# Patient Record
Sex: Female | Born: 1980 | Race: White | Hispanic: No | Marital: Married | Smoking: Current every day smoker
Health system: Southern US, Community
[De-identification: ages and names within clinical notes are randomized; demographics above are authoritative.]

## PROBLEM LIST (undated history)

## (undated) DIAGNOSIS — K029 Dental caries, unspecified: Secondary | ICD-10-CM

## (undated) DIAGNOSIS — J45909 Unspecified asthma, uncomplicated: Secondary | ICD-10-CM

## (undated) DIAGNOSIS — G43909 Migraine, unspecified, not intractable, without status migrainosus: Secondary | ICD-10-CM

## (undated) DIAGNOSIS — F1010 Alcohol abuse, uncomplicated: Secondary | ICD-10-CM

## (undated) HISTORY — PX: OTHER SURGICAL HISTORY: SHX169

## (undated) HISTORY — DX: Dental caries, unspecified: K02.9

## (undated) HISTORY — DX: Migraine, unspecified, not intractable, without status migrainosus: G43.909

## (undated) HISTORY — DX: Unspecified asthma, uncomplicated: J45.909

---

## 2008-07-28 ENCOUNTER — Inpatient Hospital Stay (HOSPITAL_COMMUNITY): Admission: EM | Admit: 2008-07-28 | Discharge: 2008-07-31 | Payer: Self-pay | Admitting: Emergency Medicine

## 2009-01-05 ENCOUNTER — Emergency Department (HOSPITAL_COMMUNITY): Admission: EM | Admit: 2009-01-05 | Discharge: 2009-01-06 | Payer: Self-pay | Admitting: Emergency Medicine

## 2011-01-19 LAB — URINALYSIS, ROUTINE W REFLEX MICROSCOPIC
Nitrite: NEGATIVE
Protein, ur: NEGATIVE mg/dL
Specific Gravity, Urine: <1.005 — ABNORMAL LOW (ref 1.005–1.030)
Urobilinogen, UA: 0.2 mg/dL (ref 0.0–1.0)
pH: 5.5 (ref 5.0–8.0)

## 2011-01-19 LAB — WET PREP, GENITAL
Clue Cells Wet Prep HPF POC: NONE SEEN
Trich, Wet Prep: NONE SEEN
Yeast Wet Prep HPF POC: NONE SEEN

## 2011-01-19 LAB — PREGNANCY, URINE: Preg Test, Ur: POSITIVE

## 2011-01-19 LAB — HEMOGLOBIN AND HEMATOCRIT, BLOOD: Hemoglobin: 13.4 g/dL (ref 12.0–15.0)
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
## 2011-02-21 NOTE — Discharge Summary (Signed)
Destiny Clay, Destiny Clay             ACCOUNT NO.:  [id number]   MEDICAL RECORD NO.:  [id number]          PATIENT TYPE:  INP   LOCATION:  A323                          FACILITY:  [facility]   PHYSICIAN:  Monte Fantasia, MD  DATE OF BIRTH:  30-Apr-1981   DATE OF ADMISSION:  07/28/2008  DATE OF DISCHARGE:  10/23/2009LH                               DISCHARGE SUMMARY   HISTORY OF PRESENT ILLNESS:  A 30 year old lady patient admitted on  July 28, 2008 with left flank pain, urinary tract infection, to rule  out pyelonephritis.  The patient had an ultrasound of the kidneys done,  which showed right kidney nonobstructed, localized dilated collecting  system in the upper pole of the right kidney, etiology not known,  possible calyceal obstruction, no evidence of any parenchymal edema or  abscess, surgically absent left kidney.  The patient received  ciprofloxacin during the stay of the hospital and IV fluids, improved  well.  WBC count on the downward trend to at present from 18.5-10.5.  The patient is asymptomatic, afebrile.  Denies any complaints.  Examined  by the bedside today.   PHYSICAL EXAMINATION:  VITAL SIGNS:  Temperature of 98.3, pulse of 75,  respirations 16, and blood pressure 102/58.  HEENT:  No pallor.  No icterus.  Pupils equal and reacting to light.  NECK:  Supple.  No JVD.  No lymphadenopathy.  RESPIRATORY:  Air entry bilaterally equal.  No rales.  No rhonchi.  CARDIOVASCULAR:  S1 and S2 heard.  No murmurs.  ABDOMEN:  Soft.  No guarding.  No rigidity.  No tenderness.  Bowel  sounds are good.  BACK:  No costovertebral angle tenderness.  EXTREMITIES:  No edema.  Pedal pulses are felt.   LABORATORY DATA:  Total WBC count of 10.5, H&H 13.4/39, and platelet  count 300.  BMP; sodium is 137, potassium 4.4, chloride 106, bicarb 26,  glucose 91, BUN 10, and creatinine 0.7.  Urine cultures, no growth.   ASSESSMENT AND PLAN:  Urinary tract infection, to rule out  pyelonephritis.  The  patient at present has normal WBC count with no  temperature spikes and is asymptomatic at present.  We will continue on  ciprofloxacin oral for 7 days.  The patient advised to drink plenty of  oral fluids.  Plan to discharge today.  The patient is on deep venous  thrombosis and gastrointestinal prophylaxis.      Monte Fantasia, MD  Electronically Signed     MP/MEDQ  D:  07/31/2008  T:  08/01/2008  Job:  [id number] [id number]

## 2011-02-21 NOTE — Group Therapy Note (Signed)
NAMECLEOPATRA, Destiny Clay             ACCOUNT NO.:  [id number]   MEDICAL RECORD NO.:  [id number]          PATIENT TYPE:  INP   LOCATION:  A323                          FACILITY:  [facility]   PHYSICIAN:  Monte Fantasia, MD  DATE OF BIRTH:  [birth year]   DATE OF PROCEDURE:  DATE OF DISCHARGE:                                 PROGRESS NOTE   HISTORY:  A 30 year old lady patient was admitted on July 28, 2008,  with left flank pain and with urinary tract infection for possible  pyelonephritis.  The patient is feeling symptomatically much better, has  tolerated Levaquin very well, and has received IV fluids.  Urine  cultures and sensitivity sent, showed no growth.  The patient's white  count has been on a downward trend.  The patient has been afebrile with  no temperature spikes.   PHYSICAL EXAMINATION:  GENERAL:  The patient by the bedside denies any  complaints.  HEENT:  No pallor.  No icterus.  NECK:  Supple.  No lymphadenopathy.  RESPIRATORY:  Air entry bilaterally equal.  No rales.  No rhonchi.  ABDOMEN:  Soft.  No organomegaly.  No masses felt.  No distention.  No  guarding.  No rigidity.  EXTREMITIES:  No edema of feet.   LABORATORY DATA:  Total WBC count of 9.5, hemoglobin 13.2/38, and  platelet of 257.  Sodium 137, potassium 3.8, chloride 107, bicarb 25,  glucose 95, BUN 10, and creatinine 0.66.  UA with moderate leukocytes,  nitrites positive and many bacteria, and too numerous wbc's.  Urine  cultures, no growth.   ASSESSMENT AND PLAN:  Urinary tract infection.  The patient was placed  on IV antibiotics and IV fluids, tolerating.  She is on ciprofloxacin.  We will continue ciprofloxacin and change to p.o. ciprofloxacin in a.m.  We will continue IV fluids, advised to drink plenty of oral fluids.  WBC  count on a downward trend.  Plan to discharge the patient in a.m.  Deep  venous thrombosis and gastrointestinal prophylaxis given.      Monte Fantasia, MD  Electronically  Signed     MP/MEDQ  D:  07/30/2008  T:  07/31/2008  Job:  [id number]

## 2011-03-10 ENCOUNTER — Emergency Department (HOSPITAL_COMMUNITY)
Admission: EM | Admit: 2011-03-10 | Discharge: 2011-03-10 | Disposition: A | Discharge status: Home / Self Care | Payer: Self-pay | Attending: Emergency Medicine | Admitting: Emergency Medicine

## 2011-03-10 DIAGNOSIS — IMO0002 Reserved for concepts with insufficient information to code with codable children: Secondary | ICD-10-CM | POA: Insufficient documentation

## 2011-03-10 DIAGNOSIS — F17200 Nicotine dependence, unspecified, uncomplicated: Secondary | ICD-10-CM | POA: Insufficient documentation

## 2011-03-10 LAB — URINE MICROSCOPIC-ADD ON

## 2011-03-10 LAB — URINALYSIS, ROUTINE W REFLEX MICROSCOPIC
Bilirubin Urine: NEGATIVE
Glucose, UA: NEGATIVE mg/dL
Ketones, ur: NEGATIVE mg/dL
Nitrite: NEGATIVE
Protein, ur: NEGATIVE mg/dL
pH: 5.5 (ref 5.0–8.0)

## 2011-07-11 LAB — BASIC METABOLIC PANEL
BUN: 6
BUN: 8
CO2: 26
Calcium: 9.2
Calcium: 9.4
Chloride: 105
Chloride: 106
Chloride: 106
GFR calc Af Amer: >60
GFR calc Af Amer: >60
GFR calc non Af Amer: >60
Glucose, Bld: 100 — ABNORMAL HIGH
Glucose, Bld: 91
Potassium: 4
Potassium: 4.4
Sodium: 137
Sodium: 137

## 2011-07-11 LAB — CBC
HCT: 38.9
HCT: 39.9
Hemoglobin: 13.3
Hemoglobin: 14.2
MCHC: 34.2
MCV: 93.2
MCV: 93.7
Platelets: 294
Platelets: 300
RBC: 4.26
RDW: 13.6
RDW: 13.7
RDW: 13.9
RDW: 14.1
WBC: 14.4 — ABNORMAL HIGH

## 2011-07-11 LAB — DIFFERENTIAL
Basophils Absolute: 0
Basophils Relative: 0
Basophils Relative: 0
Basophils Relative: 0
Basophils Relative: 1
Eosinophils Absolute: 0.1
Eosinophils Absolute: 0.2
Eosinophils Absolute: 0.2
Eosinophils Relative: 1
Eosinophils Relative: 2
Lymphocytes Relative: 18
Lymphs Abs: 2
Lymphs Abs: 2.5
Lymphs Abs: 2.8
Monocytes Absolute: 0.7
Monocytes Absolute: 1.1 — ABNORMAL HIGH
Monocytes Absolute: 1.2 — ABNORMAL HIGH
Monocytes Relative: 7
Neutro Abs: 15.1 — ABNORMAL HIGH
Neutro Abs: 5.8
Neutro Abs: 7.1
Neutrophils Relative %: 61
Neutrophils Relative %: 68
Neutrophils Relative %: 72

## 2011-07-11 LAB — URINALYSIS, ROUTINE W REFLEX MICROSCOPIC
Bilirubin Urine: NEGATIVE
Glucose, UA: 100 — AB
Protein, ur: >300 — AB
Specific Gravity, Urine: 1.025
Urobilinogen, UA: 1

## 2011-07-11 LAB — URINE CULTURE
Colony Count: NO GROWTH
Special Requests: NEGATIVE

## 2012-08-28 ENCOUNTER — Other Ambulatory Visit (HOSPITAL_COMMUNITY)
Admission: RE | Admit: 2012-08-28 | Discharge: 2012-08-28 | Disposition: A | Discharge status: Home / Self Care | Payer: Medicaid Other | Source: Ambulatory Visit | Attending: Obstetrics and Gynecology | Admitting: Obstetrics and Gynecology

## 2012-08-28 DIAGNOSIS — Z1151 Encounter for screening for human papillomavirus (HPV): Secondary | ICD-10-CM | POA: Insufficient documentation

## 2012-08-28 DIAGNOSIS — Z113 Encounter for screening for infections with a predominantly sexual mode of transmission: Secondary | ICD-10-CM | POA: Insufficient documentation

## 2012-08-28 DIAGNOSIS — Z01419 Encounter for gynecological examination (general) (routine) without abnormal findings: Secondary | ICD-10-CM | POA: Insufficient documentation

## 2012-11-05 LAB — OB RESULTS CONSOLE HEPATITIS B SURFACE ANTIGEN: Hepatitis B Surface Ag: NEGATIVE

## 2012-11-05 LAB — OB RESULTS CONSOLE RPR: RPR: NONREACTIVE

## 2012-11-05 LAB — OB RESULTS CONSOLE RUBELLA ANTIBODY, IGM: Rubella: IMMUNE

## 2012-12-03 ENCOUNTER — Encounter: Payer: Self-pay | Admitting: *Deleted

## 2012-12-03 DIAGNOSIS — K029 Dental caries, unspecified: Secondary | ICD-10-CM

## 2012-12-03 DIAGNOSIS — R519 Headache, unspecified: Secondary | ICD-10-CM

## 2012-12-03 DIAGNOSIS — K089 Disorder of teeth and supporting structures, unspecified: Secondary | ICD-10-CM

## 2012-12-03 DIAGNOSIS — J45909 Unspecified asthma, uncomplicated: Secondary | ICD-10-CM | POA: Insufficient documentation

## 2012-12-31 ENCOUNTER — Telehealth: Payer: Self-pay | Admitting: Obstetrics & Gynecology

## 2012-12-31 NOTE — Telephone Encounter (Signed)
Spoke with patient. Pt. Had light pink spotting last pm and this am; none now. [redacted] weeks pregnant. Had not had recent sex. No straining with bowel movements. +pressure and tightening in belly. +baby movement. Sometimes cramping; some leaking. Pt unsure if it's urine. Advised pt she would need to be evaluated at [facility] at [facility]. Pt stated she couldn't get to [facility] but could go to [facility]. I advised pt would need to be seen somewhere.

## 2013-01-03 ENCOUNTER — Ambulatory Visit (INDEPENDENT_AMBULATORY_CARE_PROVIDER_SITE_OTHER): Payer: Medicaid Other | Admitting: Obstetrics & Gynecology

## 2013-01-03 VITALS — BP 120/80 | Wt 120.0 lb

## 2013-01-03 DIAGNOSIS — O09299 Supervision of pregnancy with other poor reproductive or obstetric history, unspecified trimester: Secondary | ICD-10-CM

## 2013-01-03 DIAGNOSIS — Z3490 Encounter for supervision of normal pregnancy, unspecified, unspecified trimester: Secondary | ICD-10-CM

## 2013-01-03 DIAGNOSIS — O2390 Unspecified genitourinary tract infection in pregnancy, unspecified trimester: Secondary | ICD-10-CM

## 2013-01-03 LAB — POCT URINALYSIS DIPSTICK
Glucose, UA: NEGATIVE
Leukocytes, UA: NEGATIVE
Nitrite, UA: NEGATIVE
Protein, UA: NEGATIVE

## 2013-01-03 MED ORDER — METRONIDAZOLE 0.75 % VA GEL: 1.0000 | VAGINAL | Status: DC

## 2013-01-03 NOTE — Progress Notes (Signed)
Patient has not been seen for 2 months.  Patient reports good fetal movement, denies any bleeding and no rupture of membranes symptoms or contraction Is complaining of a vaginal discharge.  Exam +BV  Metrogel qhs x 5   PNII at next appointment

## 2013-01-03 NOTE — Progress Notes (Signed)
C/o pressure and light pink  Infrequent spotting. Not taking prenatal vit. "makes her sick", c/o clear watery discharge.

## 2013-01-03 NOTE — Patient Instructions (Signed)
Pregnancy - Second Trimester The second trimester of pregnancy (3 to 6 months) is a period of rapid growth for you and your baby. At the end of the sixth month, your baby is about 9 inches long and weighs 1 1/2 pounds. You will begin to feel the baby move between 18 and 20 weeks of the pregnancy. This is called quickening. Weight gain is faster. A clear fluid (colostrum) may leak out of your breasts. You may feel small contractions of the womb (uterus). This is known as false labor or Braxton-Hicks contractions. This is like a practice for labor when the baby is ready to be born. Usually, the problems with morning sickness have usually passed by the end of your first trimester. Some women develop small dark blotches (called cholasma, mask of pregnancy) on their face that usually goes away after the baby is born. Exposure to the sun makes the blotches worse. Acne may also develop in some pregnant women and pregnant women who have acne, may find that it goes away. PRENATAL EXAMS  Blood work may continue to be done during prenatal exams. These tests are done to check on your health and the probable health of your baby. Blood work is used to follow your blood levels (hemoglobin). Anemia (low hemoglobin) is common during pregnancy. Iron and vitamins are given to help prevent this. You will also be checked for diabetes between 24 and 28 weeks of the pregnancy. Some of the previous blood tests may be repeated.  The size of the uterus is measured during each visit. This is to make sure that the baby is continuing to grow properly according to the dates of the pregnancy.  Your blood pressure is checked every prenatal visit. This is to make sure you are not getting toxemia.  Your urine is checked to make sure you do not have an infection, diabetes or protein in the urine.  Your weight is checked often to make sure gains are happening at the suggested rate. This is to ensure that both you and your baby are growing  normally.  Sometimes, an ultrasound is performed to confirm the proper growth and development of the baby. This is a test which bounces harmless sound waves off the baby so your caregiver can more accurately determine due dates. Sometimes, a specialized test is done on the amniotic fluid surrounding the baby. This test is called an amniocentesis. The amniotic fluid is obtained by sticking a needle into the belly (abdomen). This is done to check the chromosomes in instances where there is a concern about possible genetic problems with the baby. It is also sometimes done near the end of pregnancy if an early delivery is required. In this case, it is done to help make sure the baby's lungs are mature enough for the baby to live outside of the womb. CHANGES OCCURING IN THE SECOND TRIMESTER OF PREGNANCY Your body goes through many changes during pregnancy. They vary from person to person. Talk to your caregiver about changes you notice that you are concerned about.  During the second trimester, you will likely have an increase in your appetite. It is normal to have cravings for certain foods. This varies from person to person and pregnancy to pregnancy.  Your lower abdomen will begin to bulge.  You may have to urinate more often because the uterus and baby are pressing on your bladder. It is also common to get more bladder infections during pregnancy (pain with urination). You can help this by   drinking lots of fluids and emptying your bladder before and after intercourse.  You may begin to get stretch marks on your hips, abdomen, and breasts. These are normal changes in the body during pregnancy. There are no exercises or medications to take that prevent this change.  You may begin to develop swollen and bulging veins (varicose veins) in your legs. Wearing support hose, elevating your feet for 15 minutes, 3 to 4 times a day and limiting salt in your diet helps lessen the problem.  Heartburn may develop  as the uterus grows and pushes up against the stomach. Antacids recommended by your caregiver helps with this problem. Also, eating smaller meals 4 to 5 times a day helps.  Constipation can be treated with a stool softener or adding bulk to your diet. Drinking lots of fluids, vegetables, fruits, and whole grains are helpful.  Exercising is also helpful. If you have been very active up until your pregnancy, most of these activities can be continued during your pregnancy. If you have been less active, it is helpful to start an exercise program such as walking.  Hemorrhoids (varicose veins in the rectum) may develop at the end of the second trimester. Warm sitz baths and hemorrhoid cream recommended by your caregiver helps hemorrhoid problems.  Backaches may develop during this time of your pregnancy. Avoid heavy lifting, wear low heal shoes and practice good posture to help with backache problems.  Some pregnant women develop tingling and numbness of their hand and fingers because of swelling and tightening of ligaments in the wrist (carpel tunnel syndrome). This goes away after the baby is born.  As your breasts enlarge, you may have to get a bigger bra. Get a comfortable, cotton, support bra. Do not get a nursing bra until the last month of the pregnancy if you will be nursing the baby.  You may get a dark line from your belly button to the pubic area called the linea nigra.  You may develop rosy cheeks because of increase blood flow to the face.  You may develop spider looking lines of the face, neck, arms and chest. These go away after the baby is born. HOME CARE INSTRUCTIONS   It is extremely important to avoid all smoking, herbs, alcohol, and unprescribed drugs during your pregnancy. These chemicals affect the formation and growth of the baby. Avoid these chemicals throughout the pregnancy to ensure the delivery of a healthy infant.  Most of your home care instructions are the same as  suggested for the first trimester of your pregnancy. Keep your caregiver's appointments. Follow your caregiver's instructions regarding medication use, exercise and diet.  During pregnancy, you are providing food for you and your baby. Continue to eat regular, well-balanced meals. Choose foods such as meat, fish, milk and other low fat dairy products, vegetables, fruits, and whole-grain breads and cereals. Your caregiver will tell you of the ideal weight gain.  A physical sexual relationship may be continued up until near the end of pregnancy if there are no other problems. Problems could include early (premature) leaking of amniotic fluid from the membranes, vaginal bleeding, abdominal pain, or other medical or pregnancy problems.  Exercise regularly if there are no restrictions. Check with your caregiver if you are unsure of the safety of some of your exercises. The greatest weight gain will occur in the last 2 trimesters of pregnancy. Exercise will help you:  Control your weight.  Get you in shape for labor and delivery.  Lose weight   after you have the baby.  Wear a good support or jogging bra for breast tenderness during pregnancy. This may help if worn during sleep. Pads or tissues may be used in the bra if you are leaking colostrum.  Do not use hot tubs, steam rooms or saunas throughout the pregnancy.  Wear your seat belt at all times when driving. This protects you and your baby if you are in an accident.  Avoid raw meat, uncooked cheese, cat litter boxes and soil used by cats. These carry germs that can cause birth defects in the baby.  The second trimester is also a good time to visit your dentist for your dental health if this has not been done yet. Getting your teeth cleaned is OK. Use a soft toothbrush. Brush gently during pregnancy.  It is easier to loose urine during pregnancy. Tightening up and strengthening the pelvic muscles will help with this problem. Practice stopping your  urination while you are going to the bathroom. These are the same muscles you need to strengthen. It is also the muscles you would use as if you were trying to stop from passing gas. You can practice tightening these muscles up 10 times a set and repeating this about 3 times per day. Once you know what muscles to tighten up, do not perform these exercises during urination. It is more likely to contribute to an infection by backing up the urine.  Ask for help if you have financial, counseling or nutritional needs during pregnancy. Your caregiver will be able to offer counseling for these needs as well as refer you for other special needs.  Your skin may become oily. If so, wash your face with mild soap, use non-greasy moisturizer and oil or cream based makeup. MEDICATIONS AND DRUG USE IN PREGNANCY  Take prenatal vitamins as directed. The vitamin should contain 1 milligram of folic acid. Keep all vitamins out of reach of children. Only a couple vitamins or tablets containing iron may be fatal to a baby or young child when ingested.  Avoid use of all medications, including herbs, over-the-counter medications, not prescribed or suggested by your caregiver. Only take over-the-counter or prescription medicines for pain, discomfort, or fever as directed by your caregiver. Do not use aspirin.  Let your caregiver also know about herbs you may be using.  Alcohol is related to a number of birth defects. This includes fetal alcohol syndrome. All alcohol, in any form, should be avoided completely. Smoking will cause low birth rate and premature babies.  Street or illegal drugs are very harmful to the baby. They are absolutely forbidden. A baby born to an addicted mother will be addicted at birth. The baby will go through the same withdrawal an adult does. SEEK MEDICAL CARE IF:  You have any concerns or worries during your pregnancy. It is better to call with your questions if you feel they cannot wait, rather  than worry about them. SEEK IMMEDIATE MEDICAL CARE IF:   An unexplained oral temperature above 102 F (38.9 C) develops, or as your caregiver suggests.  You have leaking of fluid from the vagina (birth canal). If leaking membranes are suspected, take your temperature and tell your caregiver of this when you call.  There is vaginal spotting, bleeding, or passing clots. Tell your caregiver of the amount and how many pads are used. Light spotting in pregnancy is common, especially following intercourse.  You develop a bad smelling vaginal discharge with a change in the color from clear   to white.  You continue to feel sick to your stomach (nauseated) and have no relief from remedies suggested. You vomit blood or coffee ground-like materials.  You lose more than 2 pounds of weight or gain more than 2 pounds of weight over 1 week, or as suggested by your caregiver.  You notice swelling of your face, hands, feet, or legs.  You get exposed to German measles and have never had them.  You are exposed to fifth disease or chickenpox.  You develop belly (abdominal) pain. Round ligament discomfort is a common non-cancerous (benign) cause of abdominal pain in pregnancy. Your caregiver still must evaluate you.  You develop a bad headache that does not go away.  You develop fever, diarrhea, pain with urination, or shortness of breath.  You develop visual problems, blurry, or double vision.  You fall or are in a car accident or any kind of trauma.  There is mental or physical violence at home. Document Released: 09/19/2001 Document Revised: 12/18/2011 Document Reviewed: 03/24/2009 [employer] Patient Information 2013 [employer].  

## 2013-01-07 ENCOUNTER — Encounter (HOSPITAL_COMMUNITY): Payer: Self-pay

## 2013-01-07 ENCOUNTER — Emergency Department (HOSPITAL_COMMUNITY)
Admission: EM | Admit: 2013-01-07 | Discharge: 2013-01-08 | Disposition: A | Discharge status: Home / Self Care | Payer: Medicaid Other | Source: Home / Self Care | Attending: Emergency Medicine | Admitting: Emergency Medicine

## 2013-01-07 ENCOUNTER — Telehealth: Payer: Self-pay | Admitting: Obstetrics & Gynecology

## 2013-01-07 DIAGNOSIS — R05 Cough: Secondary | ICD-10-CM | POA: Insufficient documentation

## 2013-01-07 DIAGNOSIS — N898 Other specified noninflammatory disorders of vagina: Secondary | ICD-10-CM | POA: Insufficient documentation

## 2013-01-07 DIAGNOSIS — R059 Cough, unspecified: Secondary | ICD-10-CM | POA: Insufficient documentation

## 2013-01-07 DIAGNOSIS — R6883 Chills (without fever): Secondary | ICD-10-CM | POA: Insufficient documentation

## 2013-01-07 DIAGNOSIS — Z79899 Other long term (current) drug therapy: Secondary | ICD-10-CM | POA: Insufficient documentation

## 2013-01-07 DIAGNOSIS — O219 Vomiting of pregnancy, unspecified: Secondary | ICD-10-CM | POA: Insufficient documentation

## 2013-01-07 DIAGNOSIS — O99330 Smoking (tobacco) complicating pregnancy, unspecified trimester: Secondary | ICD-10-CM | POA: Insufficient documentation

## 2013-01-07 DIAGNOSIS — R509 Fever, unspecified: Secondary | ICD-10-CM | POA: Insufficient documentation

## 2013-01-07 DIAGNOSIS — R197 Diarrhea, unspecified: Secondary | ICD-10-CM

## 2013-01-07 DIAGNOSIS — O9989 Other specified diseases and conditions complicating pregnancy, childbirth and the puerperium: Secondary | ICD-10-CM | POA: Insufficient documentation

## 2013-01-07 DIAGNOSIS — J45909 Unspecified asthma, uncomplicated: Secondary | ICD-10-CM | POA: Insufficient documentation

## 2013-01-07 DIAGNOSIS — Z3490 Encounter for supervision of normal pregnancy, unspecified, unspecified trimester: Secondary | ICD-10-CM

## 2013-01-07 DIAGNOSIS — Z905 Acquired absence of kidney: Secondary | ICD-10-CM | POA: Insufficient documentation

## 2013-01-07 DIAGNOSIS — Z8679 Personal history of other diseases of the circulatory system: Secondary | ICD-10-CM | POA: Insufficient documentation

## 2013-01-07 NOTE — ED Notes (Signed)
Pt c/o vomiting that started today, states she is having some discomfort in her ribs.  Pt saw dr Emelda Fear recently for some abd discharge and some blood, but states he didn't do anything different for her.

## 2013-01-07 NOTE — Telephone Encounter (Signed)
Pt states "very sick, unable to keep anything down, has had clear watery vaginal discharged x several days, gush of fluids today, continues to feel leaking down leg, states having chest pain due to cough. Also stated decrease in FM. Pt told to go to [facility] to be evaluated. Pt verbalized understanding.

## 2013-01-08 ENCOUNTER — Telehealth: Payer: Self-pay | Admitting: Obstetrics and Gynecology

## 2013-01-08 ENCOUNTER — Inpatient Hospital Stay (HOSPITAL_COMMUNITY)
Admission: AD | Admit: 2013-01-08 | Discharge: 2013-01-13 | DRG: 765 | Disposition: A | Discharge status: Home / Self Care | Payer: Medicaid Other | Source: Ambulatory Visit | Attending: Obstetrics & Gynecology | Admitting: Obstetrics & Gynecology

## 2013-01-08 ENCOUNTER — Inpatient Hospital Stay (HOSPITAL_COMMUNITY): Payer: Medicaid Other

## 2013-01-08 ENCOUNTER — Encounter (HOSPITAL_COMMUNITY): Payer: Self-pay | Admitting: *Deleted

## 2013-01-08 DIAGNOSIS — O99344 Other mental disorders complicating childbirth: Secondary | ICD-10-CM | POA: Diagnosis present

## 2013-01-08 DIAGNOSIS — O99892 Other specified diseases and conditions complicating childbirth: Secondary | ICD-10-CM | POA: Diagnosis present

## 2013-01-08 DIAGNOSIS — O321XX0 Maternal care for breech presentation, not applicable or unspecified: Secondary | ICD-10-CM | POA: Diagnosis present

## 2013-01-08 DIAGNOSIS — F1910 Other psychoactive substance abuse, uncomplicated: Secondary | ICD-10-CM | POA: Diagnosis present

## 2013-01-08 DIAGNOSIS — O42919 Preterm premature rupture of membranes, unspecified as to length of time between rupture and onset of labor, unspecified trimester: Secondary | ICD-10-CM | POA: Diagnosis present

## 2013-01-08 DIAGNOSIS — O4290 Premature rupture of membranes, unspecified as to length of time between rupture and onset of labor, unspecified weeks of gestation: Principal | ICD-10-CM | POA: Diagnosis present

## 2013-01-08 DIAGNOSIS — G479 Sleep disorder, unspecified: Secondary | ICD-10-CM | POA: Diagnosis present

## 2013-01-08 DIAGNOSIS — Z302 Encounter for sterilization: Secondary | ICD-10-CM

## 2013-01-08 LAB — CBC
HCT: 36.2 % (ref 36.0–46.0)
Hemoglobin: 12.5 g/dL (ref 12.0–15.0)
MCHC: 34.5 g/dL (ref 30.0–36.0)
MCV: 87.7 fL (ref 78.0–100.0)
RDW: 14.4 % (ref 11.5–15.5)

## 2013-01-08 LAB — RAPID URINE DRUG SCREEN, HOSP PERFORMED
Barbiturates: POSITIVE — AB
Benzodiazepines: NOT DETECTED
Cocaine: NOT DETECTED
Tetrahydrocannabinol: NOT DETECTED

## 2013-01-08 LAB — BASIC METABOLIC PANEL
Calcium: 9.3 mg/dL (ref 8.4–10.5)
GFR calc Af Amer: >90 mL/min (ref >90)
GFR calc non Af Amer: >90 mL/min (ref >90)
Sodium: 134 mEq/L — ABNORMAL LOW (ref 135–145)

## 2013-01-08 LAB — WET PREP, GENITAL
Trich, Wet Prep: NONE SEEN
Yeast Wet Prep HPF POC: NONE SEEN

## 2013-01-08 LAB — CBC WITH DIFFERENTIAL/PLATELET
Eosinophils Absolute: 0 10*3/uL (ref 0.0–0.7)
Lymphocytes Relative: 9 % — ABNORMAL LOW (ref 12–46)
Lymphs Abs: 1.2 10*3/uL (ref 0.7–4.0)
MCH: 30.4 pg (ref 26.0–34.0)
Neutro Abs: 12 10*3/uL — ABNORMAL HIGH (ref 1.7–7.7)
Neutrophils Relative %: 86 % — ABNORMAL HIGH (ref 43–77)
Platelets: 320 10*3/uL (ref 150–400)
RBC: 4.44 MIL/uL (ref 3.87–5.11)
WBC: 13.8 10*3/uL — ABNORMAL HIGH (ref 4.0–10.5)

## 2013-01-08 LAB — TYPE AND SCREEN: ABO/RH(D): O POS

## 2013-01-08 LAB — URINALYSIS, ROUTINE W REFLEX MICROSCOPIC
Glucose, UA: NEGATIVE mg/dL
Leukocytes, UA: NEGATIVE
Protein, ur: 100 mg/dL — AB
Specific Gravity, Urine: >1.03 — ABNORMAL HIGH (ref 1.005–1.030)
Urobilinogen, UA: 1 mg/dL (ref 0.0–1.0)

## 2013-01-08 LAB — ABO/RH: ABO/RH(D): O POS

## 2013-01-08 LAB — URINE MICROSCOPIC-ADD ON

## 2013-01-08 MED ORDER — ZOLPIDEM TARTRATE 5 MG PO TABS
5.0000 mg | ORAL_TABLET | Freq: Every evening | ORAL | Status: DC | PRN
  Administered 2013-01-08 – 2013-01-10 (×3): 5 mg via ORAL
  Filled 2013-01-08 (×3): qty 1

## 2013-01-08 MED ORDER — PRENATAL MULTIVITAMIN CH
1.0000 | ORAL_TABLET | Freq: Every day | ORAL | Status: DC
  Administered 2013-01-09 – 2013-01-10 (×2): 1 via ORAL
  Filled 2013-01-08 (×2): qty 1

## 2013-01-08 MED ORDER — AMOXICILLIN 500 MG PO CAPS
500.0000 mg | ORAL_CAPSULE | Freq: Three times a day (TID) | ORAL | Status: DC
  Administered 2013-01-10 – 2013-01-11 (×2): 500 mg via ORAL
  Filled 2013-01-08 (×6): qty 1

## 2013-01-08 MED ORDER — ALBUTEROL SULFATE HFA 108 (90 BASE) MCG/ACT IN AERS
2.0000 | INHALATION_SPRAY | Freq: Four times a day (QID) | RESPIRATORY_TRACT | Status: DC | PRN
  Filled 2013-01-08: qty 6.7

## 2013-01-08 MED ORDER — AZITHROMYCIN 500 MG PO TABS
500.0000 mg | ORAL_TABLET | Freq: Every day | ORAL | Status: DC
  Administered 2013-01-08 – 2013-01-10 (×3): 500 mg via ORAL
  Filled 2013-01-08 (×5): qty 2

## 2013-01-08 MED ORDER — GUAIFENESIN 100 MG/5ML PO SOLN
5.0000 mL | ORAL | Status: DC | PRN
  Administered 2013-01-08 – 2013-01-09 (×4): 100 mg via ORAL
  Filled 2013-01-08 (×4): qty 15

## 2013-01-08 MED ORDER — LACTATED RINGERS IV SOLN
INTRAVENOUS | Status: DC
  Administered 2013-01-08 – 2013-01-11 (×8): via INTRAVENOUS

## 2013-01-08 MED ORDER — SODIUM CHLORIDE 0.9 % IV SOLN
2.0000 g | Freq: Four times a day (QID) | INTRAVENOUS | Status: AC
  Administered 2013-01-08 – 2013-01-10 (×8): 2 g via INTRAVENOUS
  Filled 2013-01-08 (×8): qty 2000

## 2013-01-08 MED ORDER — MAGNESIUM SULFATE 40 G IN LACTATED RINGERS - SIMPLE
2.0000 g/h | INTRAVENOUS | Status: DC
  Administered 2013-01-09 – 2013-01-10 (×2): 2 g/h via INTRAVENOUS
  Filled 2013-01-08 (×4): qty 500

## 2013-01-08 MED ORDER — CALCIUM CARBONATE ANTACID 500 MG PO CHEW
2.0000 | CHEWABLE_TABLET | ORAL | Status: DC | PRN
  Administered 2013-01-09 – 2013-01-11 (×3): 400 mg via ORAL
  Filled 2013-01-08 (×3): qty 2

## 2013-01-08 MED ORDER — NICOTINE 21 MG/24HR TD PT24
21.0000 mg | MEDICATED_PATCH | Freq: Every day | TRANSDERMAL | Status: DC
  Administered 2013-01-08 – 2013-01-10 (×3): 21 mg via TRANSDERMAL
  Filled 2013-01-08 (×5): qty 1

## 2013-01-08 MED ORDER — BETAMETHASONE SOD PHOS & ACET 6 (3-3) MG/ML IJ SUSP
12.0000 mg | Freq: Once | INTRAMUSCULAR | Status: AC
  Administered 2013-01-08: 12 mg via INTRAMUSCULAR
  Filled 2013-01-08: qty 2

## 2013-01-08 MED ORDER — SODIUM CHLORIDE 0.9 % IV BOLUS (SEPSIS)
1000.0000 mL | Freq: Once | INTRAVENOUS | Status: AC
  Administered 2013-01-08: 1000 mL via INTRAVENOUS

## 2013-01-08 MED ORDER — DOCUSATE SODIUM 100 MG PO CAPS
100.0000 mg | ORAL_CAPSULE | Freq: Every day | ORAL | Status: DC
  Administered 2013-01-08 – 2013-01-09 (×2): 100 mg via ORAL
  Filled 2013-01-08 (×2): qty 1

## 2013-01-08 MED ORDER — ACETAMINOPHEN 325 MG PO TABS
650.0000 mg | ORAL_TABLET | ORAL | Status: DC | PRN
  Administered 2013-01-08 – 2013-01-11 (×5): 650 mg via ORAL
  Filled 2013-01-08 (×5): qty 2

## 2013-01-08 MED ORDER — MAGNESIUM SULFATE BOLUS VIA INFUSION
4.0000 g | Freq: Once | INTRAVENOUS | Status: AC
  Administered 2013-01-08: 4 g via INTRAVENOUS
  Filled 2013-01-08: qty 500
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
## 2013-01-08 NOTE — MAU Note (Signed)
States that she was in [place] hospital all night and had been waiting for an u/s; left AMA due to long wait;

## 2013-01-08 NOTE — ED Notes (Signed)
Pt left AMA - encouraged to return for any new or worsening s/s and to f/u with OB-GYN.

## 2013-01-08 NOTE — Progress Notes (Signed)
Ur chart review completed.  

## 2013-01-08 NOTE — Telephone Encounter (Signed)
Pt states at [facility] now to be evaluated for "leaking clear watery discharge"

## 2013-01-08 NOTE — ED Provider Notes (Signed)
History     CSN: [id number]  Arrival date & time 01/07/13  2315   First MD Initiated Contact with Patient 01/07/13 2345      Chief Complaint  Patient presents with  . Emesis     Patient is a 32 y.o. female presenting with vomiting. The history is provided by the patient.  Emesis Severity:  Moderate Duration:  1 day Timing:  Intermittent Number of daily episodes:  "several" Progression:  Worsening Chronicity:  New Relieved by:  Nothing Worsened by:  Liquids Associated symptoms: chills, cough, diarrhea and fever   Associated symptoms: no abdominal pain   PT reports she is 29 weeks.  No complications thus far in the pregnancy.  She reports she has started vomiting, and today while vomiting she noted "fluid leaking" down her leg No abdominal pain.  No vag bleeding.  No back pain.  No dysuria No contractions She called her OB who advised evaluation in the ER She has no other complaints No falls/trauma reported She reports +fetal movement  Past Medical History  Diagnosis Date  . Migraines   . Asthma   . Caries     Past Surgical History  Procedure Laterality Date  . Kidney removed Left     Family History  Problem Relation Age of Onset  . Hypertension Other   . Cancer Other     throat  . Diabetes Other   . Seizures Other   . Mental retardation Other     Bipolar/Schyophrenia    History  Substance Use Topics  . Smoking status: Current Every Day Smoker -- 1.00 packs/day    Types: Cigarettes  . Smokeless tobacco: Not on file  . Alcohol Use: No    OB History   Grav Para Term Preterm Abortions TAB SAB Ect Mult Living   9 5 5  3  2   5       Review of Systems  Constitutional: Positive for chills.  Respiratory: Positive for cough.   Gastrointestinal: Positive for vomiting and diarrhea. Negative for abdominal pain.  Genitourinary: Positive for vaginal discharge. Negative for vaginal bleeding.  Psychiatric/Behavioral: Negative for agitation.    Allergies   Review of patient's allergies indicates no known allergies.  Home Medications   Current Outpatient Rx  Name  Route  Sig  Dispense  Refill  . albuterol (PROVENTIL HFA;VENTOLIN HFA) 108 (90 BASE) MCG/ACT inhaler   Inhalation   Inhale 2 puffs into the lungs every 6 (six) hours as needed for wheezing.         . Butalbital-APAP-Caffeine (FIORICET PO)   Oral   Take 1 tablet by mouth every 4 (four) hours as needed.         Marland Kitchen HYDROcodone-homatropine (HYCODAN) 5-1.5 MG/5ML syrup   Oral   Take 5 mLs by mouth every 6 (six) hours as needed for cough.         . metroNIDAZOLE (METROGEL VAGINAL) 0.75 % vaginal gel   Vaginal   Place 1 Applicatorful vaginally 1 day or 1 dose.   70 g   0   . ALBUTEROL IN   Inhalation   Inhale 2 Inhalers into the lungs 4 (four) times daily.         . Doxylamine-Pyridoxine (DICLEGIS) 10-10 MG TBEC   Oral   Take 10 tablets by mouth. Take as directed         . prenatal vitamin w/FE, FA (PRENATAL 1 + 1) 27-1 MG TABS   Oral   Take 1  tablet by mouth daily.           BP 127/73  Pulse 99  Temp(Src) 97.6 F (36.4 C) (Oral)  Resp 18  Ht 5\' 6"  (1.676 m)  Wt 124 lb (56.246 kg)  BMI 20.02 kg/m2  SpO2 100%  LMP 06/15/2012  Physical Exam CONSTITUTIONAL: thin appearing but in no distress.  Smells of ketones HEAD: Normocephalic/atraumatic EYES: EOMI/PERRL ENMT: Mucous membranes dry , poor dentition NECK: supple no meningeal signsr CV: S1/S2 noted, no murmurs/rubs/gallops noted LUNGS: Lungs are clear to auscultation bilaterally, no apparent distress ABDOMEN: soft, nontender, no rebound or guarding GU:no cva tenderness No products of conception noted.  No pooling of fluid.  No vaginal bleeding noted.  No cervical motion tenderness noted. Chaperone present. Sterile gloves used NEURO: Pt is awake/alert, moves all extremitiesx4 EXTREMITIES: pulses normal, full ROM SKIN: warm, color normal PSYCH: no abnormalities of mood noted  ED Course   Procedures  Labs Reviewed  CBC WITH DIFFERENTIAL - Abnormal; Notable for the following:    WBC 13.8 (*)    Neutrophils Relative 86 (*)    Neutro Abs 12.0 (*)    Lymphocytes Relative 9 (*)    All other components within normal limits  URINALYSIS, ROUTINE W REFLEX MICROSCOPIC  BASIC METABOLIC PANEL     1. Pregnancy   2. Vomiting and diarrhea    After my evaluation I called the on call OB Dr Marice Potter.  She was happy to accept pt in transfer to monitor and determine if she has ruptured membranes.  Pt is clinically stable but feel she needs higher level of care.  Given her vomiting/diarrhea and smells of ketones, suspect significant dehydration.  Her abdominal exam was benign.  She denied contractions, she reports +fetal movement and no vag bleeding noted on exam.  FHT appropriate (150s per nursing).  She was given IV fluids in the ED  When  I advised transfer to [facility], she reports she can not go to [place] but wants to go to [place].  I advised her since her OB is dr Despina Hidden and given that she needs urgent transfer, the safest choice for her and child is to transfer to [place].  She refuses transport and will go to [place] by herself and husband.  I advised her that not having an evaluation by an OB soon could lead to fetal demise or further complications for her as well.  She accepts these risks and will go to [place] immediately and not wait on labwork  I discussed risk of death/disability and potential fetal death of leaving against medical advice and the patient accepts these risks.  The patient is awake/alet able to make decisions, and not intoxicated Patient discharged against medical advice.  Nursing also discussed this with patient.  She was advised she could return at anytime.     MDM  Nursing notes including past medical history and social history reviewed and considered in documentation Previous records reviewed and considered - phone records from today  reviewed         Destiny Gaskins, MD 01/08/13 [phone]

## 2013-01-09 DIAGNOSIS — O4290 Premature rupture of membranes, unspecified as to length of time between rupture and onset of labor, unspecified weeks of gestation: Secondary | ICD-10-CM

## 2013-01-09 LAB — URINE CULTURE: Colony Count: 7000

## 2013-01-09 MED ORDER — BETAMETHASONE SOD PHOS & ACET 6 (3-3) MG/ML IJ SUSP
12.0000 mg | Freq: Once | INTRAMUSCULAR | Status: AC
  Administered 2013-01-09: 12 mg via INTRAMUSCULAR
  Filled 2013-01-09: qty 2

## 2013-01-09 NOTE — Progress Notes (Signed)
Patient ID: CHELLI YERKES, female   DOB: October 13, 1980, 32 y.o.   MRN: [id number] ACULTY PRACTICE ANTEPARTUM COMPREHENSIVE PROGRESS NOTE  AURIELLE SLINGERLAND is a 32 y.o. [id number] at [redacted]w[redacted]d  who is admitted for PROM.   Fetal presentation is breech. Length of Stay:  1  Days  Subjective: Pt reports continued LOF, No VB, occ CTX, +FM  Patient reports good fetal movement.  She reports occ uterine contractions, no bleeding and no loss of fluid per vagina.  Vitals:  Blood pressure 125/66, pulse 82, temperature 98.5 F (36.9 C), temperature source Oral, resp. rate 20, height 5\' 6"  (1.676 m), weight 124 lb (56.246 kg), last menstrual period 06/15/2012, SpO2 95.00%. Physical Examination: General appearance - alert, well appearing, and in no distress Abdomen - soft, nontender, nondistended, no masses or organomegaly gravid Cervical Exam: Not evaluated. and found to be not evaluated Membranes:intact, ruptured  Fetal Monitoring:  Baseline: 120's bpm, Variability: Good {> 6 bpm), Accelerations: Reactive and Decelerations: Absent  Labs:  Results for orders placed during the hospital encounter of 01/08/13 (from the past 24 hour(s))  WET PREP, GENITAL   Collection Time    01/08/13 12:47 PM      Result Value Range   Yeast Wet Prep HPF POC NONE SEEN  NONE SEEN   Trich, Wet Prep NONE SEEN  NONE SEEN   Clue Cells Wet Prep HPF POC NONE SEEN  NONE SEEN   WBC, Wet Prep HPF POC FEW (*) NONE SEEN  GC/CHLAMYDIA PROBE AMP   Collection Time    01/08/13  1:30 PM      Result Value Range   CT Probe RNA NEGATIVE  NEGATIVE   GC Probe RNA NEGATIVE  NEGATIVE  GROUP B STREP BY PCR   Collection Time    01/08/13  1:30 PM      Result Value Range   Group B strep by PCR NEGATIVE  NEGATIVE  CBC   Collection Time    01/08/13  3:10 PM      Result Value Range   WBC 11.4 (*) 4.0 - 10.5 K/uL   RBC 4.13  3.87 - 5.11 MIL/uL   Hemoglobin 12.5  12.0 - 15.0 g/dL   HCT 13.0  86.5 - 78.4 %   MCV 87.7  78.0 - 100.0 fL   MCH  30.3  26.0 - 34.0 pg   MCHC 34.5  30.0 - 36.0 g/dL   RDW 69.6  29.5 - 28.4 %   Platelets 330  150 - 400 K/uL  TYPE AND SCREEN   Collection Time    01/08/13  3:10 PM      Result Value Range   ABO/RH(D) O POS     Antibody Screen NEG     Sample Expiration 01/11/2013    ABO/RH   Collection Time    01/08/13  3:10 PM      Result Value Range   ABO/RH(D) O POS    URINE RAPID DRUG SCREEN (HOSP PERFORMED)   Collection Time    01/08/13  6:38 PM      Result Value Range   Opiates NONE DETECTED  NONE DETECTED   Cocaine NONE DETECTED  NONE DETECTED   Benzodiazepines NONE DETECTED  NONE DETECTED   Amphetamines NONE DETECTED  NONE DETECTED   Tetrahydrocannabinol NONE DETECTED  NONE DETECTED   Barbiturates POSITIVE (*) NONE DETECTED    Imaging Studies:    sono 01/08/2013 AFI 9.51 Fetus >70th percentile   Medications:  Scheduled . ampicillin (  OMNIPEN) IV  2 g Intravenous Q6H   Followed by  . [START ON 01/10/2013] amoxicillin  500 mg Oral Q8H  . azithromycin  500 mg Oral Daily  . docusate sodium  100 mg Oral Daily  . nicotine  21 mg Transdermal Daily  . prenatal multivitamin  1 tablet Oral Q1200   I have reviewed the patient's current medications.  ASSESSMENT:PROM @ 294/7 weeks I reviewed with pt the plan of care which is to do conservative management with ABTX for latency and Magnesium for neuroprophylaxis.  Deliver @34  weeks unless fetal or maternal condition suggests compromise prior to that time.  Reviewed with patient that if th efetus remains breech she will deliver via c-section Patient Active Problem List  Diagnosis  . Head ache  . Poor dentition  . Caries involving multiple surfaces of tooth  . Asthma  . Caries    PLAN: Continue routine antenatal care. D/c Magnesium sulfate after 12 hours. Deliver at 34 weeks or sooner prn maternal or fetal indications    HARRAWAY-SMITH, Lealon Vanputten 01/09/2013,11:45 AM

## 2013-01-09 NOTE — Progress Notes (Signed)
Cardio off after reassurring FHR  

## 2013-01-09 NOTE — Progress Notes (Signed)
Pt off the monitor after reassurring FHR  

## 2013-01-10 LAB — CBC WITH DIFFERENTIAL/PLATELET
Basophils Absolute: 0 10*3/uL (ref 0.0–0.1)
Basophils Relative: 0 % (ref 0–1)
Eosinophils Absolute: 0 10*3/uL (ref 0.0–0.7)
HCT: 32.9 % — ABNORMAL LOW (ref 36.0–46.0)
Hemoglobin: 11.2 g/dL — ABNORMAL LOW (ref 12.0–15.0)
MCH: 30 pg (ref 26.0–34.0)
MCHC: 34 g/dL (ref 30.0–36.0)
Monocytes Absolute: 0.7 10*3/uL (ref 0.1–1.0)
Monocytes Relative: 5 % (ref 3–12)
Neutro Abs: 10.8 10*3/uL — ABNORMAL HIGH (ref 1.7–7.7)
RDW: 14.1 % (ref 11.5–15.5)

## 2013-01-10 MED ORDER — CYCLOBENZAPRINE HCL 10 MG PO TABS
10.0000 mg | ORAL_TABLET | Freq: Three times a day (TID) | ORAL | Status: DC | PRN
  Administered 2013-01-10 – 2013-01-11 (×2): 10 mg via ORAL
  Filled 2013-01-10 (×3): qty 1

## 2013-01-10 NOTE — Progress Notes (Signed)
Patient ID: Destiny Clay, female   DOB: 06/05/1981, 32 y.o.   MRN: [id number] ACULTY PRACTICE ANTEPARTUM COMPREHENSIVE PROGRESS NOTE  VADA SWIFT is a 32 y.o. [id number] at [redacted]w[redacted]d  who is admitted for PROM.   Fetal presentation is breech. Length of Stay:  2  Days  Subjective: Pt c/o foul smelling discharge Patient reports good fetal movement.  She reports frequent uterine contractions, no bleeding and continued loss of fluid per vagina.  Vitals:  Blood pressure 108/63, pulse 74, temperature 98 F (36.7 C), temperature source Oral, resp. rate 18, height 5\' 6"  (1.676 m), weight 124 lb (56.246 kg), last menstrual period 06/15/2012, SpO2 95.00%. Physical Examination: General appearance - alert, well appearing, and in no distress Cervical Exam: Not evaluated. Extremities: extremities normal, atraumatic, no cyanosis or edema Membranes:intact, ruptured.  Fluid on pad appear clear- non purulent  Fetal Monitoring:  Baseline: 120's bpm, Accelerations: Non-reactive but appropriate for gestational age and toco with occ ctx  Labs:  No results found for this or any previous visit (from the past 24 hour(s)).  Imaging Studies:    n/a   Medications:  Scheduled . ampicillin (OMNIPEN) IV  2 g Intravenous Q6H   Followed by  . amoxicillin  500 mg Oral Q8H  . azithromycin  500 mg Oral Daily  . docusate sodium  100 mg Oral Daily  . nicotine  21 mg Transdermal Daily  . prenatal multivitamin  1 tablet Oral Q1200   I have reviewed the patient's current medications.  ASSESSMENT: Patient Active Problem List  Diagnosis  . Head ache  . Poor dentition  . Caries involving multiple surfaces of tooth  . Asthma  . Caries  PROM PLAN: Continue on atbx for latency deliver at 34 weeks or sooner for fetal indications Continue routine antenatal care. CBC today  Watch for s/sx of infection   HARRAWAY-SMITH, Zira Helinski 01/10/2013,7:29 AM

## 2013-01-11 ENCOUNTER — Encounter (HOSPITAL_COMMUNITY): Payer: Self-pay | Admitting: Anesthesiology

## 2013-01-11 ENCOUNTER — Inpatient Hospital Stay (HOSPITAL_COMMUNITY): Payer: Medicaid Other | Admitting: Anesthesiology

## 2013-01-11 ENCOUNTER — Encounter (HOSPITAL_COMMUNITY)
Admission: AD | Disposition: A | Discharge status: Home / Self Care | Payer: Self-pay | Source: Ambulatory Visit | Attending: Obstetrics & Gynecology

## 2013-01-11 DIAGNOSIS — O4290 Premature rupture of membranes, unspecified as to length of time between rupture and onset of labor, unspecified weeks of gestation: Principal | ICD-10-CM

## 2013-01-11 DIAGNOSIS — Z302 Encounter for sterilization: Secondary | ICD-10-CM

## 2013-01-11 DIAGNOSIS — O321XX0 Maternal care for breech presentation, not applicable or unspecified: Secondary | ICD-10-CM

## 2013-01-11 DIAGNOSIS — O9989 Other specified diseases and conditions complicating pregnancy, childbirth and the puerperium: Secondary | ICD-10-CM

## 2013-01-11 DIAGNOSIS — O42919 Preterm premature rupture of membranes, unspecified as to length of time between rupture and onset of labor, unspecified trimester: Secondary | ICD-10-CM | POA: Diagnosis present

## 2013-01-11 SURGERY — Surgical Case
Anesthesia: Spinal | Site: Abdomen | Wound class: Clean Contaminated

## 2013-01-11 MED ORDER — HYDROMORPHONE HCL PF 1 MG/ML IJ SOLN
INTRAMUSCULAR | Status: AC
  Administered 2013-01-11: 0.5 mg via INTRAVENOUS
  Filled 2013-01-11: qty 1

## 2013-01-11 MED ORDER — PRENATAL MULTIVITAMIN CH
1.0000 | ORAL_TABLET | Freq: Every day | ORAL | Status: DC
  Administered 2013-01-12: 1 via ORAL
  Filled 2013-01-11: qty 1

## 2013-01-11 MED ORDER — DIBUCAINE 1 % RE OINT: 1.0000 "application " | TOPICAL_OINTMENT | RECTAL | Status: DC | PRN

## 2013-01-11 MED ORDER — MEPERIDINE HCL 25 MG/ML IJ SOLN: 6.2500 mg | INTRAMUSCULAR | Status: DC | PRN

## 2013-01-11 MED ORDER — BUPIVACAINE HCL (PF) 0.25 % IJ SOLN
INTRAMUSCULAR | Status: AC
  Filled 2013-01-11: qty 30

## 2013-01-11 MED ORDER — OXYTOCIN 10 UNIT/ML IJ SOLN
40.0000 [IU] | INTRAVENOUS | Status: DC | PRN
  Administered 2013-01-11: 40 [IU] via INTRAVENOUS

## 2013-01-11 MED ORDER — PNEUMOCOCCAL VAC POLYVALENT 25 MCG/0.5ML IJ INJ
0.5000 mL | INJECTION | INTRAMUSCULAR | Status: AC
  Administered 2013-01-12: 0.5 mL via INTRAMUSCULAR
  Filled 2013-01-11: qty 0.5

## 2013-01-11 MED ORDER — DIPHENHYDRAMINE HCL 25 MG PO CAPS: 25.0000 mg | ORAL_CAPSULE | Freq: Four times a day (QID) | ORAL | Status: DC | PRN

## 2013-01-11 MED ORDER — ACETAMINOPHEN 10 MG/ML IV SOLN
INTRAVENOUS | Status: AC
  Administered 2013-01-11: 1000 mg via INTRAVENOUS
  Filled 2013-01-11: qty 100

## 2013-01-11 MED ORDER — ACETAMINOPHEN 10 MG/ML IV SOLN: 1000.0000 mg | Freq: Once | INTRAVENOUS | Status: AC | PRN

## 2013-01-11 MED ORDER — NICOTINE 21 MG/24HR TD PT24
21.0000 mg | MEDICATED_PATCH | Freq: Every day | TRANSDERMAL | Status: DC
  Administered 2013-01-11 – 2013-01-12 (×2): 21 mg via TRANSDERMAL
  Filled 2013-01-11 (×3): qty 1

## 2013-01-11 MED ORDER — NALBUPHINE HCL 10 MG/ML IJ SOLN: 5.0000 mg | INTRAMUSCULAR | Status: DC | PRN

## 2013-01-11 MED ORDER — DIPHENHYDRAMINE HCL 50 MG/ML IJ SOLN: 12.5000 mg | INTRAMUSCULAR | Status: DC | PRN

## 2013-01-11 MED ORDER — BUPIVACAINE IN DEXTROSE 0.75-8.25 % IT SOLN
INTRATHECAL | Status: DC | PRN
  Administered 2013-01-11: 1.4 mL via INTRATHECAL

## 2013-01-11 MED ORDER — TETANUS-DIPHTH-ACELL PERTUSSIS 5-2.5-18.5 LF-MCG/0.5 IM SUSP: 0.5000 mL | Freq: Once | INTRAMUSCULAR | Status: DC

## 2013-01-11 MED ORDER — MIDAZOLAM HCL 5 MG/5ML IJ SOLN
INTRAMUSCULAR | Status: DC | PRN
  Administered 2013-01-11 (×2): 1 mg via INTRAVENOUS

## 2013-01-11 MED ORDER — METOCLOPRAMIDE HCL 5 MG/ML IJ SOLN: 10.0000 mg | Freq: Three times a day (TID) | INTRAMUSCULAR | Status: DC | PRN

## 2013-01-11 MED ORDER — EPHEDRINE 5 MG/ML INJ
INTRAVENOUS | Status: AC
  Filled 2013-01-11: qty 10

## 2013-01-11 MED ORDER — KETOROLAC TROMETHAMINE 30 MG/ML IJ SOLN: 30.0000 mg | Freq: Four times a day (QID) | INTRAMUSCULAR | Status: AC | PRN

## 2013-01-11 MED ORDER — OXYCODONE-ACETAMINOPHEN 5-325 MG PO TABS
1.0000 | ORAL_TABLET | ORAL | Status: DC | PRN
  Administered 2013-01-11 – 2013-01-13 (×9): 2 via ORAL
  Filled 2013-01-11 (×9): qty 2

## 2013-01-11 MED ORDER — OXYTOCIN 40 UNITS IN LACTATED RINGERS INFUSION - SIMPLE MED
INTRAVENOUS | Status: AC
  Administered 2013-01-11: 62.5 mL/h via INTRAVENOUS
  Filled 2013-01-11: qty 1000

## 2013-01-11 MED ORDER — OXYTOCIN 10 UNIT/ML IJ SOLN
INTRAMUSCULAR | Status: AC
  Filled 2013-01-11: qty 4

## 2013-01-11 MED ORDER — FENTANYL CITRATE 0.05 MG/ML IJ SOLN
INTRAMUSCULAR | Status: AC
  Filled 2013-01-11: qty 2

## 2013-01-11 MED ORDER — ONDANSETRON HCL 4 MG/2ML IJ SOLN
INTRAMUSCULAR | Status: AC
  Filled 2013-01-11: qty 2

## 2013-01-11 MED ORDER — MISOPROSTOL 200 MCG PO TABS
ORAL_TABLET | ORAL | Status: AC
  Administered 2013-01-11: 1000 ug via RECTAL
  Filled 2013-01-11: qty 5

## 2013-01-11 MED ORDER — SCOPOLAMINE 1 MG/3DAYS TD PT72
MEDICATED_PATCH | TRANSDERMAL | Status: AC
  Administered 2013-01-11: 1.5 mg via TRANSDERMAL
  Filled 2013-01-11: qty 1

## 2013-01-11 MED ORDER — HYDROMORPHONE HCL PF 1 MG/ML IJ SOLN
0.2500 mg | INTRAMUSCULAR | Status: DC | PRN
  Administered 2013-01-11 (×2): 0.5 mg via INTRAVENOUS

## 2013-01-11 MED ORDER — OXYTOCIN 40 UNITS IN LACTATED RINGERS INFUSION - SIMPLE MED: 62.5000 mL/h | INTRAVENOUS | Status: AC

## 2013-01-11 MED ORDER — SODIUM BICARBONATE 8.4 % IV SOLN
INTRAVENOUS | Status: DC | PRN
  Administered 2013-01-11: 30 mL via EPIDURAL

## 2013-01-11 MED ORDER — HYDROMORPHONE HCL PF 1 MG/ML IJ SOLN
0.2500 mg | INTRAMUSCULAR | Status: DC | PRN
  Administered 2013-01-11: 0.5 mg via INTRAVENOUS

## 2013-01-11 MED ORDER — NALOXONE HCL 1 MG/ML IJ SOLN: 1.0000 ug/kg/h | INTRAVENOUS | Status: DC | PRN

## 2013-01-11 MED ORDER — NALBUPHINE HCL 10 MG/ML IJ SOLN
5.0000 mg | INTRAMUSCULAR | Status: DC | PRN
  Filled 2013-01-11: qty 1

## 2013-01-11 MED ORDER — LANOLIN HYDROUS EX OINT: 1.0000 "application " | TOPICAL_OINTMENT | CUTANEOUS | Status: DC | PRN

## 2013-01-11 MED ORDER — DEXTROSE IN LACTATED RINGERS 5 % IV SOLN
INTRAVENOUS | Status: DC
  Administered 2013-01-12: 05:00:00 via INTRAVENOUS

## 2013-01-11 MED ORDER — DIPHENHYDRAMINE HCL 50 MG/ML IJ SOLN: 25.0000 mg | INTRAMUSCULAR | Status: DC | PRN

## 2013-01-11 MED ORDER — ONDANSETRON HCL 4 MG/2ML IJ SOLN: 4.0000 mg | Freq: Three times a day (TID) | INTRAMUSCULAR | Status: DC | PRN

## 2013-01-11 MED ORDER — WITCH HAZEL-GLYCERIN EX PADS: 1.0000 "application " | MEDICATED_PAD | CUTANEOUS | Status: DC | PRN

## 2013-01-11 MED ORDER — CEFAZOLIN SODIUM-DEXTROSE 2-3 GM-% IV SOLR
INTRAVENOUS | Status: DC | PRN
  Administered 2013-01-11: 2 g via INTRAVENOUS

## 2013-01-11 MED ORDER — IBUPROFEN 600 MG PO TABS
600.0000 mg | ORAL_TABLET | Freq: Four times a day (QID) | ORAL | Status: DC
  Administered 2013-01-11 – 2013-01-13 (×7): 600 mg via ORAL
  Filled 2013-01-11 (×7): qty 1

## 2013-01-11 MED ORDER — MISOPROSTOL 200 MCG PO TABS
ORAL_TABLET | ORAL | Status: AC
  Filled 2013-01-11: qty 1

## 2013-01-11 MED ORDER — BUPIVACAINE HCL 0.25 % IJ SOLN
INTRAMUSCULAR | Status: DC | PRN
  Administered 2013-01-11: 30 mL

## 2013-01-11 MED ORDER — PROMETHAZINE HCL 25 MG/ML IJ SOLN: 6.2500 mg | INTRAMUSCULAR | Status: DC | PRN

## 2013-01-11 MED ORDER — TETANUS-DIPHTH-ACELL PERTUSSIS 5-2.5-18.5 LF-MCG/0.5 IM SUSP
0.5000 mL | Freq: Once | INTRAMUSCULAR | Status: DC
Start: 2013-01-12 — End: 2013-01-11

## 2013-01-11 MED ORDER — MORPHINE SULFATE 0.5 MG/ML IJ SOLN
INTRAMUSCULAR | Status: AC
  Filled 2013-01-11: qty 10

## 2013-01-11 MED ORDER — MIDAZOLAM HCL 2 MG/2ML IJ SOLN
INTRAMUSCULAR | Status: AC
  Filled 2013-01-11: qty 2

## 2013-01-11 MED ORDER — ONDANSETRON HCL 4 MG/2ML IJ SOLN: 4.0000 mg | INTRAMUSCULAR | Status: DC | PRN

## 2013-01-11 MED ORDER — CITRIC ACID-SODIUM CITRATE 334-500 MG/5ML PO SOLN
ORAL | Status: AC
  Administered 2013-01-11: 30 mL
  Filled 2013-01-11: qty 15

## 2013-01-11 MED ORDER — ONDANSETRON HCL 4 MG/2ML IJ SOLN
INTRAMUSCULAR | Status: DC | PRN
  Administered 2013-01-11: 4 mg via INTRAVENOUS

## 2013-01-11 MED ORDER — SIMETHICONE 80 MG PO CHEW
80.0000 mg | CHEWABLE_TABLET | Freq: Three times a day (TID) | ORAL | Status: DC
  Administered 2013-01-11 – 2013-01-12 (×4): 80 mg via ORAL

## 2013-01-11 MED ORDER — MISOPROSTOL 200 MCG PO TABS: 1000.0000 ug | ORAL_TABLET | Freq: Once | ORAL | Status: AC

## 2013-01-11 MED ORDER — IBUPROFEN 600 MG PO TABS: 600.0000 mg | ORAL_TABLET | Freq: Four times a day (QID) | ORAL | Status: DC | PRN

## 2013-01-11 MED ORDER — FENTANYL CITRATE 0.05 MG/ML IJ SOLN
INTRAMUSCULAR | Status: DC | PRN
  Administered 2013-01-11: 50 ug via INTRAVENOUS
  Administered 2013-01-11: 35 ug via INTRAVENOUS
  Administered 2013-01-11: 15 ug via INTRATHECAL
  Administered 2013-01-11: 100 ug via INTRAVENOUS

## 2013-01-11 MED ORDER — CHLOROPROCAINE HCL 3 % IJ SOLN
INTRAMUSCULAR | Status: AC
  Filled 2013-01-11: qty 20

## 2013-01-11 MED ORDER — SENNOSIDES-DOCUSATE SODIUM 8.6-50 MG PO TABS: 2.0000 | ORAL_TABLET | Freq: Every day | ORAL | Status: DC

## 2013-01-11 MED ORDER — LACTATED RINGERS IV SOLN
INTRAVENOUS | Status: DC | PRN
  Administered 2013-01-11 (×2): via INTRAVENOUS

## 2013-01-11 MED ORDER — SCOPOLAMINE 1 MG/3DAYS TD PT72: 1.0000 | MEDICATED_PATCH | Freq: Once | TRANSDERMAL | Status: DC

## 2013-01-11 MED ORDER — KETOROLAC TROMETHAMINE 60 MG/2ML IM SOLN
60.0000 mg | Freq: Once | INTRAMUSCULAR | Status: AC | PRN
  Filled 2013-01-11: qty 2

## 2013-01-11 MED ORDER — SIMETHICONE 80 MG PO CHEW: 80.0000 mg | CHEWABLE_TABLET | ORAL | Status: DC | PRN

## 2013-01-11 MED ORDER — ONDANSETRON HCL 4 MG PO TABS: 4.0000 mg | ORAL_TABLET | ORAL | Status: DC | PRN

## 2013-01-11 MED ORDER — SODIUM CHLORIDE 0.9 % IJ SOLN: 3.0000 mL | INTRAMUSCULAR | Status: DC | PRN

## 2013-01-11 MED ORDER — PHENYLEPHRINE 40 MCG/ML (10ML) SYRINGE FOR IV PUSH (FOR BLOOD PRESSURE SUPPORT)
PREFILLED_SYRINGE | INTRAVENOUS | Status: AC
  Filled 2013-01-11: qty 5

## 2013-01-11 MED ORDER — ZOLPIDEM TARTRATE 5 MG PO TABS
5.0000 mg | ORAL_TABLET | Freq: Every evening | ORAL | Status: DC | PRN
  Administered 2013-01-11 – 2013-01-12 (×2): 5 mg via ORAL
  Filled 2013-01-11 (×2): qty 1

## 2013-01-11 MED ORDER — INFLUENZA VIRUS VACC SPLIT PF IM SUSP
0.5000 mL | Freq: Once | INTRAMUSCULAR | Status: AC
  Administered 2013-01-11: 0.5 mL via INTRAMUSCULAR
  Filled 2013-01-11: qty 0.5

## 2013-01-11 MED ORDER — NALOXONE HCL 0.4 MG/ML IJ SOLN: 0.4000 mg | INTRAMUSCULAR | Status: DC | PRN

## 2013-01-11 MED ORDER — MORPHINE SULFATE (PF) 0.5 MG/ML IJ SOLN
INTRAMUSCULAR | Status: DC | PRN
  Administered 2013-01-11: 1 mg via EPIDURAL
  Administered 2013-01-11: 1 mg via INTRAVENOUS
  Administered 2013-01-11: .9 mg via INTRAVENOUS
  Administered 2013-01-11 (×2): 1 mg via INTRAVENOUS
  Administered 2013-01-11: .1 mg via INTRATHECAL

## 2013-01-11 MED ORDER — DIPHENHYDRAMINE HCL 25 MG PO CAPS: 25.0000 mg | ORAL_CAPSULE | ORAL | Status: DC | PRN

## 2013-01-11 MED ORDER — MENTHOL 3 MG MT LOZG: 1.0000 | LOZENGE | OROMUCOSAL | Status: DC | PRN

## 2013-01-11 MED ORDER — MEASLES, MUMPS & RUBELLA VAC ~~LOC~~ INJ
0.5000 mL | INJECTION | Freq: Once | SUBCUTANEOUS | Status: DC
  Filled 2013-01-11: qty 0.5

## 2013-01-11 SURGICAL SUPPLY — 35 items
BENZOIN TINCTURE PRP APPL 2/3 (GAUZE/BANDAGES/DRESSINGS) ×2 IMPLANT
CLIP FILSHIE TUBAL LIGA STRL (Clip) ×2 IMPLANT
CONTAINER PREFILL 10% NBF 15ML (MISCELLANEOUS) IMPLANT
DRAPE LG THREE QUARTER DISP (DRAPES) ×2 IMPLANT
DRSG OPSITE POSTOP 4X10 (GAUZE/BANDAGES/DRESSINGS) ×2 IMPLANT
DRSG OPSITE POSTOP 4X12 (GAUZE/BANDAGES/DRESSINGS) ×2 IMPLANT
DURAPREP 26ML APPLICATOR (WOUND CARE) ×2 IMPLANT
ELECT REM PT RETURN 9FT ADLT (ELECTROSURGICAL) ×2
ELECTRODE REM PT RTRN 9FT ADLT (ELECTROSURGICAL) ×1 IMPLANT
EXTRACTOR VACUUM M CUP 4 TUBE (SUCTIONS) IMPLANT
GLOVE BIOGEL PI IND STRL 6.5 (GLOVE) ×1 IMPLANT
GLOVE BIOGEL PI INDICATOR 6.5 (GLOVE) ×1
GLOVE SS N UNI LF 7.5 STRL (GLOVE) ×2 IMPLANT
GLOVE SURG SS PI 6.0 STRL IVOR (GLOVE) ×2 IMPLANT
GOWN STRL REIN XL XLG (GOWN DISPOSABLE) ×4 IMPLANT
KIT ABG SYR 3ML LUER SLIP (SYRINGE) ×2 IMPLANT
NEEDLE HYPO 25X1 1.5 SAFETY (NEEDLE) ×2 IMPLANT
NEEDLE HYPO 25X5/8 SAFETYGLIDE (NEEDLE) ×2 IMPLANT
NS IRRIG 1000ML POUR BTL (IV SOLUTION) ×2 IMPLANT
PACK C SECTION WH (CUSTOM PROCEDURE TRAY) ×2 IMPLANT
PAD ABD 7.5X8 STRL (GAUZE/BANDAGES/DRESSINGS) ×4 IMPLANT
PAD OB MATERNITY 4.3X12.25 (PERSONAL CARE ITEMS) ×2 IMPLANT
RTRCTR C-SECT PINK 25CM LRG (MISCELLANEOUS) ×2 IMPLANT
SEPRAFILM MEMBRANE 5X6 (MISCELLANEOUS) IMPLANT
SLEEVE SCD COMPRESS KNEE MED (MISCELLANEOUS) IMPLANT
STAPLER VISISTAT 35W (STAPLE) IMPLANT
STRIP CLOSURE SKIN 1/2X4 (GAUZE/BANDAGES/DRESSINGS) ×2 IMPLANT
SUT PLAIN 0 NONE (SUTURE) IMPLANT
SUT VIC AB 0 CT1 36 (SUTURE) ×8 IMPLANT
SUT VIC AB 4-0 KS 27 (SUTURE) ×2 IMPLANT
SYR CONTROL 10ML LL (SYRINGE) ×2 IMPLANT
TAPE CLOTH SURG 4X10 WHT LF (GAUZE/BANDAGES/DRESSINGS) ×2 IMPLANT
TOWEL OR 17X24 6PK STRL BLUE (TOWEL DISPOSABLE) ×6 IMPLANT
TRAY FOLEY CATH 14FR (SET/KITS/TRAYS/PACK) ×2 IMPLANT
WATER STERILE IRR 1000ML POUR (IV SOLUTION) ×2 IMPLANT

## 2013-01-11 NOTE — Preoperative (Signed)
Beta Blockers   Reason not to administer Beta Blockers:Not Applicable 

## 2013-01-11 NOTE — Plan of Care (Signed)
Problem: Phase II Progression Outcomes Goal: Progress activity as tolerated unless otherwise ordered Outcome: Progressing Tolerated up in chair to go to NICU Goal: Tolerating diet Outcome: Completed/Met Date Met:  01/11/13 Tolerated Regular diet well

## 2013-01-11 NOTE — Progress Notes (Signed)
Patient ID: Destiny Clay, female   DOB: 14-Oct-1980, 32 y.o.   MRN: [id number] Called to evaluate patient with painful contractions. Patient reports intense abdominal pain with contractions, tearful.  FHT: baseline 150, mod variability, no accels, no decels Toco: ctx q 2 minutes Abd: soft, gravid, NT  Bedside ultrasound: fetus in breech presentation  A/P 30 w with signs/symptoms of chorioamnionitis - Fetal status reassuring at this time - Patient counseled on need for delivery - Discussed delivery via c-section given fetal malpresentation. Risks, benefits and alternatives were explained including but not limited to risks of bleeding, infection and damage to adjacent organs. Patient also desires permanent sterilization. Consent signed

## 2013-01-11 NOTE — Anesthesia Preprocedure Evaluation (Addendum)
Anesthesia Evaluation  Patient identified by MRN, date of birth, ID band Patient awake    Reviewed: Allergy & Precautions, H&P , NPO status , Patient's Chart, lab work & pertinent test results  Airway Mallampati: II TM Distance: >3 FB Neck ROM: Full    Dental  (+) Poor Dentition   Pulmonary asthma , Current Smoker,  breath sounds clear to auscultation        Cardiovascular negative cardio ROS  Rhythm:Regular Rate:Normal     Neuro/Psych  Headaches, negative psych ROS   GI/Hepatic negative GI ROS, Neg liver ROS,   Endo/Other  negative endocrine ROS  Renal/GU negative Renal ROS     Musculoskeletal negative musculoskeletal ROS (+)   Abdominal (+) - obese,   Peds  Hematology negative hematology ROS (+)   Anesthesia Other Findings   Reproductive/Obstetrics (+) Pregnancy                          Anesthesia Physical Anesthesia Plan  ASA: III and emergent  Anesthesia Plan: Spinal   Post-op Pain Management:    Induction: Intravenous  Airway Management Planned:   Additional Equipment:   Intra-op Plan:   Post-operative Plan:   Informed Consent: I have reviewed the patients History and Physical, chart, labs and discussed the procedure including the risks, benefits and alternatives for the proposed anesthesia with the patient or authorized representative who has indicated his/her understanding and acceptance.   Dental advisory given  Plan Discussed with: CRNA  Anesthesia Plan Comments:         Anesthesia Quick Evaluation

## 2013-01-11 NOTE — Progress Notes (Signed)
Asked to eval pt. In PACU, due to vaginal bleeding.  RN has gotten several clots out.  With vigorous massage a large clot removed and vaginal exam revealed few more clots.  Pt. Felt warm.  Pt. Was firm and had no further trickling.  Cytotec 1000 mcg pr given.

## 2013-01-11 NOTE — Op Note (Signed)
Preoperative Diagnosis:  IUP @ [redacted]w[redacted]d, PPROM, laboring, breech presentation, undesired fertility, Probable drug use  Postoperative Diagnosis:  Same  Procedure: Repeat low transverse cesarean section, Bilateral Tubal Ligation with Filshie clips  Surgeon: Tinnie Gens, M.D.  Assistant: None  Anesthesia: Spinal and Local and IV sedation  Findings: Viable female infant, APGAR (1 MIN): 8   APGAR (5 MINS): 8, Normal tubes and ovaries, infant Breech  Estimated blood loss: 1200 cc  Complications: None known  Specimens: Placenta to Pathology  Reason for procedure: Briefly, the patient is a 32 y.o. [id number] at [redacted]w[redacted]d with PPROM.  Admitted on 3 days prior to delivery.  Had been receiving antibiotics, s/p Betamethasone, who became very uncomfortable and found to be laboring and breech.  She also desires permanent sterility.  Patient counseled, r.e. Risks benefits of BTL, including permanency of procedure, risk of failure(1:100), increased risk of ectopic.  Patient verbalized understanding and desires to proceed   Procedure: Patient is a to the OR where spinal analgesia was administered. She was then placed in a supine position with left lateral tilt. She received 2 g of Ancef and SCDs were in place. A Foley catheter was placed in the bladder. She was prepped and draped in the usual sterile fashion. A timeout was performed. A knife was then used to make a Pfannenstiel incision. This incision was carried out to underlying fascia which was divided in the midline with the knife. The incision was extended laterally, bluntly. The fascia was dissected of the underlying rectus superiorly.  The rectus was divided in the midline.  The peritoneal cavity was entered bluntly.  Alexis retractor was placed inside the incision.  A knife was used to make a low transverse incision on the uterus. This incision was carried down to the amniotic cavity was entered. Fetus was in breech presentation and was brought up out of the  incision without difficulty. Cord was clamped x 2 and cut. Spontaneous crying noted. Infant taken to waiting pediatrician.  Cord blood was obtained. Cord pH obtained Placenta was delivered from the uterus.  Uterus was cleaned with dry lap pads. Uterine incision closed with 0 Vicryl suture in a locked running fashion. Attention was turned to the pt's left tube which was grasped with a Babcock clamp and followed to its fimbriated end.  A Filshie clip was placed across the tube 1.5 cm from the cornu.  Attention was turned to the pt's right tube which was grasped with a Babcock clamp and followed to its fimbriated end.  A Filshie clip was placed across the tube 1.5 cm from the cornu. A 1cc of 0.25% Marcaine was injected into the surrounding tubes bilaterally.  Alexis retractor was removed from the abdomen. Peritoneal closure was done with 0 Vicryl suture.  There was bleeding from the left rectus and electrocautery was used to obtain hemostasis.  Fascia is closed with 0 Vicryl suture in a running fashion. Subcutaneous tissue infused with 30cc 0.25% Marcaine.  Skin closed using 3-0 Vicryl on a Keith needle.  Steri strips applied, followed by pressure dressing.  All instrument, needle and lap counts were correct x 2.  Patient was awake and taken to PACU stable.  Infant to NICU, stable.

## 2013-01-11 NOTE — Anesthesia Postprocedure Evaluation (Addendum)
Anesthesia Post Note  Patient: Destiny Clay  Procedure(s) Performed: Procedure(s) (LRB): CESAREAN SECTION (N/A)  Anesthesia type: Spinal  Patient location: PACU  Post pain: Pain level controlled  Post assessment: Post-op Vital signs reviewed  Last Vitals: BP 111/64  Pulse 91  Temp(Src) 37 C (Oral)  Resp 22  Ht 5\' 6"  (1.676 m)  Wt 124 lb (56.246 kg)  BMI 20.02 kg/m2  SpO2 96%  LMP 06/15/2012  Post vital signs: Reviewed  Level of consciousness: sedated  Complications: No apparent anesthesia complications

## 2013-01-11 NOTE — Transfer of Care (Signed)
Immediate Anesthesia Transfer of Care Note  Patient: Destiny Clay  Procedure(s) Performed: Procedure(s): CESAREAN SECTION (N/A)  Patient Location: PACU  Anesthesia Type:Spinal  Level of Consciousness: awake, alert , oriented and patient cooperative  Airway & Oxygen Therapy: Patient Spontanous Breathing  Post-op Assessment: Report given to PACU RN and Post -op Vital signs reviewed and stable  Post vital signs: Reviewed and stable  Complications: No apparent anesthesia complications

## 2013-01-11 NOTE — Anesthesia Procedure Notes (Signed)
Spinal  Patient location during procedure: OR Start time: 01/11/2013 9:07 AM End time: 01/11/2013 9:12 AM Staffing Anesthesiologist: Lewie Loron R Performed by: anesthesiologist  Preanesthetic Checklist Completed: patient identified, site marked, surgical consent, pre-op evaluation, timeout performed, IV checked, risks and benefits discussed and monitors and equipment checked Spinal Block Patient position: sitting Prep: DuraPrep Patient monitoring: heart rate, continuous pulse ox and blood pressure Approach: midline Location: L3-4 Injection technique: single-shot Needle Needle type: Sprotte  Needle gauge: 24 G Needle length: 9 cm Assessment Sensory level: T6 Additional Notes Expiration date of kit checked and confirmed. Patient tolerated procedure well, without complications.

## 2013-01-11 NOTE — Progress Notes (Signed)
Patient ID: Destiny Clay, female   DOB: Dec 14, 1980, 32 y.o.   MRN: [id number] ACULTY PRACTICE ANTEPARTUM COMPREHENSIVE PROGRESS NOTE  Destiny Clay is a 32 y.o. [id number] at [redacted]w[redacted]d  who is admitted for PROM.   Fetal presentation is breech. Length of Stay:  3  Days  Subjective: Pt c/o generalized body aches, she reports irregular contractions, continued leakage of fluid, no vaginal bleeding and good fetal movement   Vitals:  Blood pressure 110/65, pulse 82, temperature 97.7 F (36.5 C), temperature source Oral, resp. rate 18, height 5\' 6"  (1.676 m), weight 124 lb (56.246 kg), last menstrual period 06/15/2012, SpO2 95.00%. Physical Examination: General appearance - alert, well appearing, and in no distress Abdomen: soft, gravid, NT Cervical Exam: Not evaluated.  Membranes: ruptured.   Extremities: extremities normal, atraumatic, no cyanosis or edema   Fetal Monitoring:  Baseline: 140 bpm, Variability: Good {> 6 bpm), Accelerations: Non-reactive but appropriate for gestational age and toco with occ ctx  Labs:  Results for orders placed during the hospital encounter of 01/08/13 (from the past 24 hour(s))  CBC WITH DIFFERENTIAL   Collection Time    01/10/13  8:11 AM      Result Value Range   WBC 13.2 (*) 4.0 - 10.5 K/uL   RBC 3.73 (*) 3.87 - 5.11 MIL/uL   Hemoglobin 11.2 (*) 12.0 - 15.0 g/dL   HCT 46.9 (*) 62.9 - 52.8 %   MCV 88.2  78.0 - 100.0 fL   MCH 30.0  26.0 - 34.0 pg   MCHC 34.0  30.0 - 36.0 g/dL   RDW 41.3  24.4 - 01.0 %   Platelets 292  150 - 400 K/uL   Neutrophils Relative 82 (*) 43 - 77 %   Neutro Abs 10.8 (*) 1.7 - 7.7 K/uL   Lymphocytes Relative 13  12 - 46 %   Lymphs Abs 1.7  0.7 - 4.0 K/uL   Monocytes Relative 5  3 - 12 %   Monocytes Absolute 0.7  0.1 - 1.0 K/uL   Eosinophils Relative 0  0 - 5 %   Eosinophils Absolute 0.0  0.0 - 0.7 K/uL   Basophils Relative 0  0 - 1 %   Basophils Absolute 0.0  0.0 - 0.1 K/uL    Imaging Studies:    n/a   Medications:   Scheduled . amoxicillin  500 mg Oral Q8H  . azithromycin  500 mg Oral Daily  . docusate sodium  100 mg Oral Daily  . nicotine  21 mg Transdermal Daily  . prenatal multivitamin  1 tablet Oral Q1200   I have reviewed the patient's current medications.  ASSESSMENT: Patient Active Problem List  Diagnosis  . Head ache  . Poor dentition  . Caries involving multiple surfaces of tooth  . Asthma  . Caries  PROM PLAN: Continue on atbx for latency deliver at 34 weeks or sooner for fetal indications Continue routine antenatal care. Watch for s/sx of infection   Wendal Wilkie 01/11/2013,6:13 AM

## 2013-01-11 NOTE — Progress Notes (Signed)
Patient c/o pain.  Dr. Renold Don notified and orders received to give percocet.

## 2013-01-12 LAB — CBC
MCH: 30.2 pg (ref 26.0–34.0)
MCHC: 33.7 g/dL (ref 30.0–36.0)
Platelets: 170 10*3/uL (ref 150–400)
Platelets: 216 10*3/uL (ref 150–400)
RDW: 14 % (ref 11.5–15.5)
WBC: 14.4 10*3/uL — ABNORMAL HIGH (ref 4.0–10.5)

## 2013-01-12 MED ORDER — TETANUS-DIPHTH-ACELL PERTUSSIS 5-2.5-18.5 LF-MCG/0.5 IM SUSP
0.5000 mL | Freq: Once | INTRAMUSCULAR | Status: AC
  Administered 2013-01-12: 0.5 mL via INTRAMUSCULAR
  Filled 2013-01-12: qty 0.5

## 2013-01-12 MED ORDER — FERROUS SULFATE 325 (65 FE) MG PO TABS
325.0000 mg | ORAL_TABLET | Freq: Two times a day (BID) | ORAL | Status: DC
  Administered 2013-01-12: 325 mg via ORAL
  Filled 2013-01-12: qty 1

## 2013-01-12 NOTE — Progress Notes (Signed)
Post Op Day 1  Subjective: no complaints, up ad lib, voiding, tolerating PO and + flatus  Objective: Blood pressure 120/78, pulse 85, temperature 97.4 F (36.3 C), temperature source Oral, resp. rate 18, height 5\' 6"  (1.676 m), weight 124 lb (56.246 kg), last menstrual period 06/15/2012, SpO2 98.00%.  BP ranges: 93-141/40-106 (two isolated elevated BP's around 11:45/12:00 yesterday morning, other BP's have been good)  Physical Exam:  General: alert, cooperative and no distress Lochia: appropriate Uterine Fundus: firm Incision: honeycomb dressing on incision, shows some blood underneath DVT Evaluation: No evidence of DVT seen on physical exam. Negative Homan's sign. No cords or calf tenderness.   Recent Labs  01/10/13 0811 01/12/13 0614  HGB 11.2* 6.8*  HCT 32.9* 20.2*    Assessment/Plan: Plan for discharge tomorrow Contraception: had BTL Not breastfeeding  Hgb 6.8 (down from 11.2 on 4/4) - will start on iron and recheck at noon.    LOS: 4 days   Levert Feinstein 01/12/2013, 7:34 AM    Pt denies s&s of anemia, up ad lib without difficulty, VSS. I saw and examined patient along with student and agree with above note.   FRAZIER,NATALIE 01/12/2013 9:39 AM

## 2013-01-12 NOTE — Anesthesia Postprocedure Evaluation (Signed)
  Anesthesia Post-op Note  Patient: Destiny Clay  Procedure(s) Performed: Procedure(s): CESAREAN SECTION (N/A)  Patient Location: [facility]  Anesthesia Type:Spinal  Level of Consciousness: awake, alert , oriented and patient cooperative  Airway and Oxygen Therapy: Patient Spontanous Breathing  Post-op Pain: mild  Post-op Assessment: Patient's Cardiovascular Status Stable, Respiratory Function Stable, Patent Airway, No signs of Nausea or vomiting, Adequate PO intake and Pain level controlled  Post-op Vital Signs: Reviewed and stable  Complications: No apparent anesthesia complications

## 2013-01-12 NOTE — Plan of Care (Signed)
Problem: Phase II Progression Outcomes Goal: Pain controlled on oral analgesia Outcome: Completed/Met Date Met:  01/12/13 Good pain control on po Percocet at this time Goal: Progress activity as tolerated unless otherwise ordered Outcome: Completed/Met Date Met:  01/12/13 Walked to bathroom for peri care and tolerated well

## 2013-01-12 NOTE — Plan of Care (Signed)
Problem: Discharge Progression Outcomes Goal: Barriers To Progression Addressed/Resolved Outcome: Completed/Met Date Met:  01/12/13 Vaginal bleeding in control Goal: Activity appropriate for discharge plan Outcome: Completed/Met Date Met:  01/12/13 Tolerates walking in the hall well Goal: Tolerating diet Outcome: Completed/Met Date Met:  01/12/13 Eating regular diet and tolerating well. Goal: Complications resolved/controlled Outcome: Completed/Met Date Met:  01/12/13 Vaginal bleeding is scant Goal: Pain controlled with appropriate interventions Outcome: Completed/Met Date Met:  01/12/13 Good pain control on po Percocet

## 2013-01-12 NOTE — H&P (Signed)

## 2013-01-13 ENCOUNTER — Encounter (HOSPITAL_COMMUNITY): Payer: Self-pay | Admitting: *Deleted

## 2013-01-13 DIAGNOSIS — G479 Sleep disorder, unspecified: Secondary | ICD-10-CM | POA: Diagnosis present

## 2013-01-13 MED ORDER — NICOTINE 21 MG/24HR TD PT24: 1.0000 | MEDICATED_PATCH | Freq: Every day | TRANSDERMAL | Status: DC

## 2013-01-13 MED ORDER — IBUPROFEN 600 MG PO TABS: 600.0000 mg | ORAL_TABLET | Freq: Four times a day (QID) | ORAL | Status: DC

## 2013-01-13 MED ORDER — OXYCODONE-ACETAMINOPHEN 5-325 MG PO TABS: 1.0000 | ORAL_TABLET | ORAL | Status: DC | PRN

## 2013-01-13 MED ORDER — ZOLPIDEM TARTRATE 5 MG PO TABS: 5.0000 mg | ORAL_TABLET | Freq: Every evening | ORAL | Status: DC | PRN

## 2013-01-13 NOTE — Discharge Summary (Signed)
Attestation of Attending Supervision of Advanced Practitioner (PA/CNM/NP): Evaluation and management procedures were performed by the Advanced Practitioner under my supervision and collaboration.  I have reviewed the Advanced Practitioner's note and chart, and I agree with the management and plan.  Makilah Dowda, MD, FACOG Attending Obstetrician & Gynecologist Faculty Practice, [facility]  

## 2013-01-13 NOTE — Discharge Summary (Signed)
Obstetric Discharge Summary Reason for Admission: onset of labor Prenatal Procedures: NST Intrapartum Procedures: cesarean: low cervical, transverse and tubal ligation Postpartum Procedures: none Complications-Operative and Postpartum: none Hemoglobin  Date Value Range Status  01/12/2013 8.1* 12.0 - 15.0 g/dL Final     HCT  Date Value Range Status  01/12/2013 23.9* 36.0 - 46.0 % Final  Hospital Course: HPI (copied from H&P) Destiny Clay is a 32 y.o. [id number] at [redacted]w[redacted]d who presents to the MAU today to be evaluated for leaking fluid. First felt fluid leakin glast week then yesterday felt a gush when she went to the bathroom. Has had some bleeding today. No clots. Prior to today was just spotting. Denies having contractions. Has felt baby move as recently as a few minutes ago.  Preoperative Diagnosis: IUP @ [redacted]w[redacted]d, PPROM, laboring, breech presentation, undesired fertility, Probable drug use  Postoperative Diagnosis: Same  Procedure: Repeat low transverse cesarean section, Bilateral Tubal Ligation with Filshie clips  Surgeon: Tinnie Gens, M.D.  Assistant: None  Anesthesia: Spinal and Local and IV sedation  Findings: Viable female infant, APGAR (1 MIN): 8  APGAR (5 MINS): 8, Normal tubes and ovaries, infant Breech  Baby went to NICU.  She has done well and is tolerating POs and ambulating. Wants to go home.  Physical Exam:  General: alert, cooperative and no distress Lochia: appropriate Uterine Fundus: firm Incision: healing well, no significant drainage DVT Evaluation: No evidence of DVT seen on physical exam. Negative Homan's sign.  Discharge Diagnoses: Term Pregnancy-delivered  Discharge Information: Date: 01/13/2013 Activity: unrestricted and pelvic rest Diet: routine Medications: Ibuprofen and Percocet Condition: stable Instructions: refer to practice specific booklet Discharge to: home   Newborn Data: Live born female  Birth Weight: 3 lb 5.6 oz (1520 g) APGAR: 8, 8 Will  stay in NICU.  [place] 01/13/2013, 7:32 AM

## 2013-01-13 NOTE — Progress Notes (Signed)
Ur chart review completed.  

## 2013-01-16 ENCOUNTER — Encounter: Payer: Medicaid Other | Admitting: Obstetrics & Gynecology

## 2013-01-16 ENCOUNTER — Other Ambulatory Visit: Payer: Medicaid Other

## 2013-01-16 ENCOUNTER — Telehealth: Payer: Self-pay | Admitting: *Deleted

## 2013-01-16 MED ORDER — OXYCODONE-ACETAMINOPHEN 5-325 MG PO TABS: 1.0000 | ORAL_TABLET | ORAL | Status: DC | PRN

## 2013-01-16 NOTE — Telephone Encounter (Signed)
Pt requesting refill on Percocet 5/325mg , c/o abdominal pain from c-section on 01/11/2013

## 2013-01-16 NOTE — Telephone Encounter (Signed)
Pt informed Rx for Percocet at front desk, stated would have her husband, Chonda Baney, to pick up RX. Pt told to have husband to have ID with him.

## 2013-01-20 ENCOUNTER — Other Ambulatory Visit: Payer: Medicaid Other

## 2013-01-28 ENCOUNTER — Telehealth: Payer: Self-pay | Admitting: *Deleted

## 2013-01-28 NOTE — Telephone Encounter (Signed)
C/o tenderness and pain at incision site, pt states had c-section April 5,2014. Pt states concerned because she feels knots at incision site. Offered pt an appt for tomorrow at 2:15 pm, Pt states not able to come tomorrow, appt made for Friday st 12:30 pm. Pt states no fever or drainage at incision site.

## 2013-01-31 ENCOUNTER — Encounter: Payer: Medicaid Other | Admitting: Obstetrics & Gynecology

## 2013-02-14 ENCOUNTER — Encounter: Payer: Medicaid Other | Admitting: Obstetrics & Gynecology

## 2013-02-19 ENCOUNTER — Ambulatory Visit: Payer: Medicaid Other | Admitting: Adult Health

## 2013-05-16 ENCOUNTER — Telehealth: Payer: Self-pay | Admitting: *Deleted

## 2013-05-16 NOTE — Telephone Encounter (Signed)
Pt states wants to verify a tubal ligation was performed with c-section. Pt informed tubal was done.

## 2014-08-10 ENCOUNTER — Encounter (HOSPITAL_COMMUNITY): Payer: Self-pay | Admitting: *Deleted

## 2015-07-30 ENCOUNTER — Inpatient Hospital Stay (HOSPITAL_COMMUNITY)
Admission: EM | Admit: 2015-07-30 | Discharge: 2015-08-01 | DRG: 193 | Disposition: A | Discharge status: Home / Self Care | Payer: Medicaid Other | Attending: Internal Medicine | Admitting: Internal Medicine

## 2015-07-30 ENCOUNTER — Encounter (HOSPITAL_COMMUNITY): Payer: Self-pay | Admitting: *Deleted

## 2015-07-30 ENCOUNTER — Emergency Department (HOSPITAL_COMMUNITY): Payer: Medicaid Other

## 2015-07-30 DIAGNOSIS — F17210 Nicotine dependence, cigarettes, uncomplicated: Secondary | ICD-10-CM | POA: Diagnosis present

## 2015-07-30 DIAGNOSIS — Z8249 Family history of ischemic heart disease and other diseases of the circulatory system: Secondary | ICD-10-CM | POA: Diagnosis not present

## 2015-07-30 DIAGNOSIS — Z79891 Long term (current) use of opiate analgesic: Secondary | ICD-10-CM

## 2015-07-30 DIAGNOSIS — J9621 Acute and chronic respiratory failure with hypoxia: Secondary | ICD-10-CM | POA: Insufficient documentation

## 2015-07-30 DIAGNOSIS — J189 Pneumonia, unspecified organism: Secondary | ICD-10-CM | POA: Diagnosis present

## 2015-07-30 DIAGNOSIS — Z833 Family history of diabetes mellitus: Secondary | ICD-10-CM | POA: Diagnosis not present

## 2015-07-30 DIAGNOSIS — J4530 Mild persistent asthma, uncomplicated: Secondary | ICD-10-CM | POA: Diagnosis not present

## 2015-07-30 DIAGNOSIS — J449 Chronic obstructive pulmonary disease, unspecified: Secondary | ICD-10-CM | POA: Diagnosis present

## 2015-07-30 DIAGNOSIS — J069 Acute upper respiratory infection, unspecified: Secondary | ICD-10-CM

## 2015-07-30 DIAGNOSIS — G894 Chronic pain syndrome: Secondary | ICD-10-CM | POA: Diagnosis present

## 2015-07-30 DIAGNOSIS — J45901 Unspecified asthma with (acute) exacerbation: Secondary | ICD-10-CM

## 2015-07-30 DIAGNOSIS — R0600 Dyspnea, unspecified: Secondary | ICD-10-CM | POA: Diagnosis present

## 2015-07-30 DIAGNOSIS — J9601 Acute respiratory failure with hypoxia: Secondary | ICD-10-CM | POA: Diagnosis present

## 2015-07-30 DIAGNOSIS — Z810 Family history of intellectual disabilities: Secondary | ICD-10-CM

## 2015-07-30 DIAGNOSIS — R0602 Shortness of breath: Secondary | ICD-10-CM | POA: Diagnosis not present

## 2015-07-30 DIAGNOSIS — J45909 Unspecified asthma, uncomplicated: Secondary | ICD-10-CM | POA: Diagnosis present

## 2015-07-30 DIAGNOSIS — J4540 Moderate persistent asthma, uncomplicated: Secondary | ICD-10-CM | POA: Diagnosis not present

## 2015-07-30 DIAGNOSIS — Z720 Tobacco use: Secondary | ICD-10-CM | POA: Diagnosis present

## 2015-07-30 DIAGNOSIS — F1010 Alcohol abuse, uncomplicated: Secondary | ICD-10-CM | POA: Diagnosis present

## 2015-07-30 DIAGNOSIS — R0902 Hypoxemia: Secondary | ICD-10-CM | POA: Insufficient documentation

## 2015-07-30 HISTORY — DX: Alcohol abuse, uncomplicated: F10.10

## 2015-07-30 LAB — CBC WITH DIFFERENTIAL/PLATELET
BAND NEUTROPHILS: 0 %
BASOS ABS: 0 10*3/uL (ref 0.0–0.1)
BASOS PCT: 0 %
Blasts: 0 %
EOS ABS: 0.8 10*3/uL — AB (ref 0.0–0.7)
Eosinophils Relative: 3 %
HEMATOCRIT: 42 % (ref 36.0–46.0)
HEMOGLOBIN: 14.7 g/dL (ref 12.0–15.0)
LYMPHS PCT: 13 %
Lymphs Abs: 3.4 10*3/uL (ref 0.7–4.0)
MCH: 31.7 pg (ref 26.0–34.0)
MCHC: 35 g/dL (ref 30.0–36.0)
MCV: 90.5 fL (ref 78.0–100.0)
METAMYELOCYTES PCT: 0 %
MONO ABS: 0.8 10*3/uL (ref 0.1–1.0)
MYELOCYTES: 0 %
Monocytes Relative: 3 %
Neutro Abs: 21.4 10*3/uL — ABNORMAL HIGH (ref 1.7–7.7)
Neutrophils Relative %: 81 %
Other: 0 %
PROMYELOCYTES ABS: 0 %
Platelets: 448 10*3/uL — ABNORMAL HIGH (ref 150–400)
RBC: 4.64 MIL/uL (ref 3.87–5.11)
RDW: 14.9 % (ref 11.5–15.5)
WBC: 26.4 10*3/uL — ABNORMAL HIGH (ref 4.0–10.5)
nRBC: 0 /100 WBC

## 2015-07-30 LAB — HEPATIC FUNCTION PANEL
ALK PHOS: 116 U/L (ref 38–126)
ALT: 19 U/L (ref 14–54)
AST: 25 U/L (ref 15–41)
Albumin: 3.5 g/dL (ref 3.5–5.0)
BILIRUBIN INDIRECT: 0.4 mg/dL (ref 0.3–0.9)
Bilirubin, Direct: 0.1 mg/dL (ref 0.1–0.5)
TOTAL PROTEIN: 7.7 g/dL (ref 6.5–8.1)
Total Bilirubin: 0.5 mg/dL (ref 0.3–1.2)

## 2015-07-30 LAB — INFLUENZA PANEL BY PCR (TYPE A & B)
H1N1 flu by pcr: NOT DETECTED
INFLBPCR: NEGATIVE
Influenza A By PCR: NEGATIVE

## 2015-07-30 LAB — BASIC METABOLIC PANEL
ANION GAP: 11 (ref 5–15)
CHLORIDE: 92 mmol/L — AB (ref 101–111)
CO2: 29 mmol/L (ref 22–32)
CREATININE: 0.55 mg/dL (ref 0.44–1.00)
Calcium: 8.7 mg/dL — ABNORMAL LOW (ref 8.9–10.3)
GFR calc non Af Amer: >60 mL/min (ref >60)
Glucose, Bld: 78 mg/dL (ref 65–99)
Potassium: 3.6 mmol/L (ref 3.5–5.1)
Sodium: 132 mmol/L — ABNORMAL LOW (ref 135–145)

## 2015-07-30 LAB — TROPONIN I: Troponin I: <0.03 ng/mL (ref <0.031)

## 2015-07-30 LAB — LIPASE, BLOOD: LIPASE: 17 U/L (ref 11–51)

## 2015-07-30 MED ORDER — HYDROMORPHONE HCL 1 MG/ML IJ SOLN
1.0000 mg | INTRAMUSCULAR | Status: DC | PRN
  Administered 2015-07-30 – 2015-07-31 (×2): 1 mg via INTRAVENOUS
  Filled 2015-07-30 (×2): qty 1

## 2015-07-30 MED ORDER — HYDROMORPHONE HCL 1 MG/ML IJ SOLN
1.0000 mg | Freq: Once | INTRAMUSCULAR | Status: AC
  Administered 2015-07-30: 1 mg via INTRAVENOUS
  Filled 2015-07-30: qty 1

## 2015-07-30 MED ORDER — SODIUM CHLORIDE 0.9 % IV SOLN: INTRAVENOUS | Status: DC

## 2015-07-30 MED ORDER — DEXTROSE 5 % IV SOLN
500.0000 mg | INTRAVENOUS | Status: DC
  Administered 2015-07-31: 500 mg via INTRAVENOUS
  Filled 2015-07-30 (×2): qty 500

## 2015-07-30 MED ORDER — METHADONE HCL 10 MG PO TABS
145.0000 mg | ORAL_TABLET | Freq: Every day | ORAL | Status: DC
  Administered 2015-07-31 – 2015-08-01 (×2): 145 mg via ORAL
  Filled 2015-07-30 (×2): qty 15

## 2015-07-30 MED ORDER — ALBUTEROL SULFATE (2.5 MG/3ML) 0.083% IN NEBU
2.5000 mg | INHALATION_SOLUTION | RESPIRATORY_TRACT | Status: DC | PRN
  Administered 2015-07-31: 2.5 mg via RESPIRATORY_TRACT
  Filled 2015-07-30 (×2): qty 3

## 2015-07-30 MED ORDER — ONDANSETRON HCL 4 MG/2ML IJ SOLN
4.0000 mg | Freq: Once | INTRAMUSCULAR | Status: AC
  Administered 2015-07-30: 4 mg via INTRAVENOUS
  Filled 2015-07-30: qty 2

## 2015-07-30 MED ORDER — SODIUM CHLORIDE 0.9 % IV SOLN
INTRAVENOUS | Status: DC
  Administered 2015-07-31: via INTRAVENOUS

## 2015-07-30 MED ORDER — ENOXAPARIN SODIUM 40 MG/0.4ML ~~LOC~~ SOLN
40.0000 mg | SUBCUTANEOUS | Status: DC
  Administered 2015-07-30 – 2015-07-31 (×2): 40 mg via SUBCUTANEOUS
  Filled 2015-07-30 (×2): qty 0.4

## 2015-07-30 MED ORDER — IPRATROPIUM-ALBUTEROL 0.5-2.5 (3) MG/3ML IN SOLN
RESPIRATORY_TRACT | Status: AC
  Administered 2015-07-30: 3 mL
  Filled 2015-07-30: qty 3

## 2015-07-30 MED ORDER — AZITHROMYCIN 250 MG PO TABS
500.0000 mg | ORAL_TABLET | Freq: Once | ORAL | Status: AC
  Administered 2015-07-30: 500 mg via ORAL
  Filled 2015-07-30: qty 2

## 2015-07-30 MED ORDER — DEXTROSE 5 % IV SOLN
1.0000 g | Freq: Once | INTRAVENOUS | Status: AC
  Administered 2015-07-30: 1 g via INTRAVENOUS
  Filled 2015-07-30: qty 10

## 2015-07-30 MED ORDER — METHYLPREDNISOLONE SODIUM SUCC 125 MG IJ SOLR
125.0000 mg | Freq: Once | INTRAMUSCULAR | Status: AC
  Administered 2015-07-30: 125 mg via INTRAVENOUS
  Filled 2015-07-30: qty 2

## 2015-07-30 MED ORDER — IPRATROPIUM-ALBUTEROL 0.5-2.5 (3) MG/3ML IN SOLN
3.0000 mL | Freq: Once | RESPIRATORY_TRACT | Status: AC
  Administered 2015-07-30: 3 mL via RESPIRATORY_TRACT
  Filled 2015-07-30: qty 3

## 2015-07-30 MED ORDER — DEXTROSE 5 % IV SOLN
1.0000 g | INTRAVENOUS | Status: DC
  Administered 2015-07-31: 1 g via INTRAVENOUS
  Filled 2015-07-30 (×2): qty 10

## 2015-07-30 MED ORDER — CETYLPYRIDINIUM CHLORIDE 0.05 % MT LIQD
7.0000 mL | Freq: Two times a day (BID) | OROMUCOSAL | Status: DC
  Administered 2015-07-31 – 2015-08-01 (×3): 7 mL via OROMUCOSAL

## 2015-07-30 MED ORDER — SODIUM CHLORIDE 0.9 % IV BOLUS (SEPSIS)
1000.0000 mL | Freq: Once | INTRAVENOUS | Status: AC
  Administered 2015-07-30: 1000 mL via INTRAVENOUS

## 2015-07-30 NOTE — ED Notes (Signed)
Pt taken to room - placed on o2 at 2 l/min, report given to receiving nurse

## 2015-07-30 NOTE — ED Notes (Signed)
[facility], all questions answered.

## 2015-07-30 NOTE — ED Notes (Addendum)
Right rib pain. Intermittent fever since yesterday. Cough, productive, yellow, green and white in color. Body aches yesterday. SOB at times. Pt is tearful in triage. RA O2 sat 79-80 at triage.
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
## 2015-07-30 NOTE — ED Notes (Signed)
Pt up ambulated to restroom, upon return patients oxygen sats 60's, increased to 70's with [place], Patient placed on NRB and returned to baseline of low 90's. NRB was on for around 10 mins and patient was put back on [place] at 4L and maintained baseline sats,

## 2015-07-31 ENCOUNTER — Inpatient Hospital Stay (HOSPITAL_COMMUNITY): Payer: Medicaid Other

## 2015-07-31 DIAGNOSIS — F1010 Alcohol abuse, uncomplicated: Secondary | ICD-10-CM

## 2015-07-31 DIAGNOSIS — R0902 Hypoxemia: Secondary | ICD-10-CM

## 2015-07-31 DIAGNOSIS — Z720 Tobacco use: Secondary | ICD-10-CM

## 2015-07-31 DIAGNOSIS — J4530 Mild persistent asthma, uncomplicated: Secondary | ICD-10-CM

## 2015-07-31 DIAGNOSIS — J189 Pneumonia, unspecified organism: Principal | ICD-10-CM

## 2015-07-31 DIAGNOSIS — G894 Chronic pain syndrome: Secondary | ICD-10-CM

## 2015-07-31 LAB — COMPREHENSIVE METABOLIC PANEL
ALT: 17 U/L (ref 14–54)
AST: 19 U/L (ref 15–41)
Albumin: 2.9 g/dL — ABNORMAL LOW (ref 3.5–5.0)
Alkaline Phosphatase: 89 U/L (ref 38–126)
Anion gap: 7 (ref 5–15)
BUN: 6 mg/dL (ref 6–20)
CHLORIDE: 100 mmol/L — AB (ref 101–111)
CO2: 29 mmol/L (ref 22–32)
CREATININE: 0.5 mg/dL (ref 0.44–1.00)
Calcium: 8.5 mg/dL — ABNORMAL LOW (ref 8.9–10.3)
GFR calc Af Amer: >60 mL/min (ref >60)
GLUCOSE: 155 mg/dL — AB (ref 65–99)
Potassium: 5.2 mmol/L — ABNORMAL HIGH (ref 3.5–5.1)
Sodium: 136 mmol/L (ref 135–145)
Total Bilirubin: 0.2 mg/dL — ABNORMAL LOW (ref 0.3–1.2)
Total Protein: 6.8 g/dL (ref 6.5–8.1)

## 2015-07-31 LAB — CBC
HEMATOCRIT: 39.1 % (ref 36.0–46.0)
Hemoglobin: 13.1 g/dL (ref 12.0–15.0)
MCH: 30.4 pg (ref 26.0–34.0)
MCHC: 33.5 g/dL (ref 30.0–36.0)
MCV: 90.7 fL (ref 78.0–100.0)
Platelets: 449 10*3/uL — ABNORMAL HIGH (ref 150–400)
RBC: 4.31 MIL/uL (ref 3.87–5.11)
RDW: 14.9 % (ref 11.5–15.5)
WBC: 16.1 10*3/uL — ABNORMAL HIGH (ref 4.0–10.5)

## 2015-07-31 LAB — BLOOD GAS, ARTERIAL
Acid-Base Excess: 5.1 mmol/L — ABNORMAL HIGH (ref 0.0–2.0)
Bicarbonate: 28.3 mEq/L — ABNORMAL HIGH (ref 20.0–24.0)
Drawn by: 22223
O2 CONTENT: 6 L/min
O2 Saturation: 92.2 %
PCO2 ART: 48.3 mmHg — AB (ref 35.0–45.0)
PH ART: 7.405 (ref 7.350–7.450)
Patient temperature: 37
TCO2: 15.7 mmol/L (ref 0–100)
pO2, Arterial: 69.1 mmHg — ABNORMAL LOW (ref 80.0–100.0)

## 2015-07-31 LAB — STREP PNEUMONIAE URINARY ANTIGEN: STREP PNEUMO URINARY ANTIGEN: NEGATIVE

## 2015-07-31 MED ORDER — BUDESONIDE 0.25 MG/2ML IN SUSP
0.2500 mg | Freq: Two times a day (BID) | RESPIRATORY_TRACT | Status: DC
  Administered 2015-07-31 – 2015-08-01 (×3): 0.25 mg via RESPIRATORY_TRACT
  Filled 2015-07-31 (×5): qty 2

## 2015-07-31 MED ORDER — OXYCODONE HCL 5 MG PO TABS
5.0000 mg | ORAL_TABLET | ORAL | Status: DC | PRN
  Administered 2015-07-31 (×2): 5 mg via ORAL
  Filled 2015-07-31 (×2): qty 1

## 2015-07-31 MED ORDER — DM-GUAIFENESIN ER 30-600 MG PO TB12
1.0000 | ORAL_TABLET | Freq: Two times a day (BID) | ORAL | Status: DC
  Administered 2015-07-31 – 2015-08-01 (×3): 1 via ORAL
  Filled 2015-07-31 (×2): qty 1

## 2015-07-31 MED ORDER — OXYCODONE HCL 5 MG PO TABS
5.0000 mg | ORAL_TABLET | Freq: Four times a day (QID) | ORAL | Status: DC | PRN
  Administered 2015-07-31: 10 mg via ORAL
  Administered 2015-07-31: 5 mg via ORAL
  Administered 2015-08-01 (×2): 10 mg via ORAL
  Filled 2015-07-31 (×2): qty 2
  Filled 2015-07-31: qty 1
  Filled 2015-07-31: qty 2

## 2015-07-31 MED ORDER — METHOCARBAMOL 500 MG PO TABS
500.0000 mg | ORAL_TABLET | Freq: Three times a day (TID) | ORAL | Status: DC | PRN
  Administered 2015-07-31: 500 mg via ORAL
  Filled 2015-07-31: qty 1

## 2015-07-31 MED ORDER — NICOTINE 14 MG/24HR TD PT24
14.0000 mg | MEDICATED_PATCH | Freq: Every day | TRANSDERMAL | Status: DC
  Administered 2015-07-31 – 2015-08-01 (×3): 14 mg via TRANSDERMAL
  Filled 2015-07-31 (×3): qty 1

## 2015-07-31 NOTE — Progress Notes (Signed)
[facility] PROGRESS NOTE  Destiny Clay Clay ZOX:[id number] DOB: 05-Dec-1980 DOA: 07/30/2015 PCP: No PCP Per Patient  Assessment/Plan: 1-acute resp failure with hypoxia due to CAP: patient is feeling slightly better and is no febrile -WBC's trending down -still with decrease air movement and requiring 4L [place] to maintain O2 sat -will start patient on pulmicort, mucinex and flutter valve -will continue current IV antibiotics -follow culture data -flu by PCR is negative -will repeat CXR in am -will continue PRN nebulizer treatment   2-tobacco abuse: cessation counseling provided Will continue nicotine patch  3-alcohol abuse: patient reports to be sober and following outpatient program -she is on methadone for it   4-leukocytosis: due to #1 -will continue antibiotics and follow WBC's trend  5-chronic pain and acute pleurisy from PNA: -continue methadone -started on oxycodone  -will use robaxin    Code Status: Full Family Communication: husband at bedside  Disposition Plan: remains inpatient    Consultants:  None   Procedures:  See below for x-ray reports   Antibiotics:  Rocephin and zithromax 10/21  HPI/Subjective: Feeling slightly better, still coughing a lot and with pleurisy pain. No fever. On 4L [place] to maintain O2 sat above 90%  Objective: Filed Vitals:   07/31/15 0550  BP: 104/65  Pulse: 71  Temp: 98.5 F (36.9 C)  Resp: 18    Intake/Output Summary (Last 24 hours) at 07/31/15 1253 Last data filed at 07/31/15 0856  Gross per 24 hour  Intake 906.67 ml  Output    800 ml  Net 106.67 ml   Filed Weights   07/30/15 1829 07/30/15 2302  Weight: 48.081 kg (106 lb) 46.358 kg (102 lb 3.2 oz)    Exam:   General:  Afebrile, complaining of right side CP (from coughing), no nausea or vomiting. reprots some slight improvement in breathing. On 4L [place] to maintain O2 sat above 90%  Cardiovascular: S1 and s2, no rubs or gallops  Respiratory: decrease air  movement, no wheezing, diffuse rhonchi  Abdomen: soft, NT, ND, positive BS  Musculoskeletal:  No edema, no cyanosis or clubbing   Data Reviewed: Basic Metabolic Panel:  Recent Labs Lab 07/30/15 1920 07/31/15 0522  NA 132* 136  K 3.6 5.2*  CL 92* 100*  CO2 29 29  GLUCOSE 78 155*  BUN <5* 6  CREATININE 0.55 0.50  CALCIUM 8.7* 8.5*   Liver Function Tests:  Recent Labs Lab 07/30/15 1920 07/31/15 0522  AST 25 19  ALT 19 17  ALKPHOS 116 89  BILITOT 0.5 0.2*  PROT 7.7 6.8  ALBUMIN 3.5 2.9*    Recent Labs Lab 07/30/15 1920  LIPASE 17   CBC:  Recent Labs Lab 07/30/15 1920 07/31/15 0522  WBC 26.4* 16.1*  NEUTROABS 21.4*  --   HGB 14.7 13.1  HCT 42.0 39.1  MCV 90.5 90.7  PLT 448* 449*   Cardiac Enzymes:  Recent Labs Lab 07/30/15 1920  TROPONINI <0.03   CBG: No results for input(s): GLUCAP in the last 168 hours.  Recent Results (from the past 240 hour(s))  Culture, blood (routine x 2)     Status: None (Preliminary result)   Collection Time: 07/30/15  9:10 PM  Result Value Ref Range Status   Specimen Description LEFT ANTECUBITAL  Final   Special Requests BOTTLES DRAWN AEROBIC AND ANAEROBIC 6CC  Final   Culture NO GROWTH < 12 HOURS  Final   Report Status PENDING  Incomplete  Culture, blood (routine x 2)  Status: None (Preliminary result)   Collection Time: 07/30/15  9:16 PM  Result Value Ref Range Status   Specimen Description LEFT ANTECUBITAL  Final   Special Requests BOTTLES DRAWN AEROBIC AND ANAEROBIC 6CC  Final   Culture NO GROWTH < 12 HOURS  Final   Report Status PENDING  Incomplete     Studies: Dg Chest 2 View  07/30/2015  CLINICAL DATA:  Fever, cough, shortness of breath, body aches, right rib pain EXAM: CHEST  2 VIEW COMPARISON:  None. FINDINGS: Diffuse reticulonodular opacities in the bilateral lungs. Suspected 7 mm cavitary nodular opacity overlying the right mid lung. Overall, this appearance suggests an atypical mycobacterial,  viral, or fungal infection. No pleural effusion or pneumothorax. The heart is normal in size. Visualized osseous structures are within normal limits. IMPRESSION: Diffuse reticulonodular opacities in the bilateral lungs, suggesting atypical infection. CT chest is suggested for further evaluation. Electronically Signed   By: Charline Bills M.D.   On: 07/30/2015 19:40   Ct Chest Wo Contrast  07/30/2015  CLINICAL DATA:  RIGHT rib pain, intermittent fever since yesterday, cough productive of yellow, green, and white material, body aches since yesterday, shortness of breath, LEFT nephrectomy for an unspecified cause, smoker, asthma EXAM: CT CHEST WITHOUT CONTRAST TECHNIQUE: Multidetector CT imaging of the chest was performed following the standard protocol without IV contrast. Sagittal and coronal MPR images reconstructed from axial data set. COMPARISON:  Chest radiograph 07/30/2015 FINDINGS: Minimal atherosclerotic calcification. Upper normal sized RIGHT paratracheal lymph nodes up to 10 mm short axis. No additional adenopathy, though hilar assessment is limited by lack of IV contrast. Visualized upper abdomen unremarkable. Peribronchovascular reticulonodular tree-in-bud opacities are identified throughout both lungs with a lower lobe predominance. Minimal associated peribronchial thickening. Few areas of scattered nonspecific ground-glass attenuation in the RIGHT middle lobe. No nodular or cavitary foci identified. No pleural effusion or pneumothorax. Osseous structures unremarkable. IMPRESSION: Tree in bud opacities throughout both lungs greatest in the lower lobes with minimal associated ground-glass opacity in RIGHT middle lobe. While atypical infectious etiologies can cause this appearance, this is most likely due to infectious bronchiolitis of bacterial or potentially viral etiology. Recommend followup radiographs until resolution to exclude other etiologies. Electronically Signed   By: Ulyses Southward M.D.    On: 07/30/2015 20:24    Scheduled Meds: . antiseptic oral rinse  7 mL Mouth Rinse BID  . azithromycin  500 mg Intravenous Q24H  . budesonide (PULMICORT) nebulizer solution  0.25 mg Nebulization BID  . cefTRIAXone (ROCEPHIN)  IV  1 g Intravenous Q24H  . dextromethorphan-guaiFENesin  1 tablet Oral BID  . enoxaparin (LOVENOX) injection  40 mg Subcutaneous Q24H  . methadone  145 mg Oral Daily  . nicotine  14 mg Transdermal Daily   Continuous Infusions: . sodium chloride 100 mL/hr at 07/31/15 0000    Active Problems:   Asthma   CAP (community acquired pneumonia)   Tobacco abuse   Community acquired pneumonia    Time spent: 30 minutes    Vassie Loll  [facility] Pager [phone]. If 7PM-7AM, please contact night-coverage at [email or web address], password [facility] 07/31/2015, 12:53 PM  LOS: 1 day

## 2015-08-01 ENCOUNTER — Inpatient Hospital Stay (HOSPITAL_COMMUNITY): Payer: Medicaid Other

## 2015-08-01 DIAGNOSIS — J9621 Acute and chronic respiratory failure with hypoxia: Secondary | ICD-10-CM

## 2015-08-01 DIAGNOSIS — G894 Chronic pain syndrome: Secondary | ICD-10-CM | POA: Insufficient documentation

## 2015-08-01 DIAGNOSIS — J45909 Unspecified asthma, uncomplicated: Secondary | ICD-10-CM

## 2015-08-01 DIAGNOSIS — F1010 Alcohol abuse, uncomplicated: Secondary | ICD-10-CM | POA: Insufficient documentation

## 2015-08-01 DIAGNOSIS — R0602 Shortness of breath: Secondary | ICD-10-CM

## 2015-08-01 DIAGNOSIS — F17200 Nicotine dependence, unspecified, uncomplicated: Secondary | ICD-10-CM

## 2015-08-01 DIAGNOSIS — R0902 Hypoxemia: Secondary | ICD-10-CM | POA: Insufficient documentation

## 2015-08-01 DIAGNOSIS — J4540 Moderate persistent asthma, uncomplicated: Secondary | ICD-10-CM

## 2015-08-01 LAB — CBC
HCT: 41.2 % (ref 36.0–46.0)
Hemoglobin: 13.6 g/dL (ref 12.0–15.0)
MCH: 30.4 pg (ref 26.0–34.0)
MCHC: 33 g/dL (ref 30.0–36.0)
MCV: 92 fL (ref 78.0–100.0)
PLATELETS: 428 10*3/uL — AB (ref 150–400)
RBC: 4.48 MIL/uL (ref 3.87–5.11)
RDW: 14.9 % (ref 11.5–15.5)
WBC: 13 10*3/uL — AB (ref 4.0–10.5)

## 2015-08-01 LAB — BASIC METABOLIC PANEL
ANION GAP: 5 (ref 5–15)
BUN: 5 mg/dL — ABNORMAL LOW (ref 6–20)
CO2: 32 mmol/L (ref 22–32)
Calcium: 8.4 mg/dL — ABNORMAL LOW (ref 8.9–10.3)
Chloride: 101 mmol/L (ref 101–111)
Creatinine, Ser: 0.56 mg/dL (ref 0.44–1.00)
GFR calc Af Amer: >60 mL/min (ref >60)
Glucose, Bld: 88 mg/dL (ref 65–99)
POTASSIUM: 4.4 mmol/L (ref 3.5–5.1)
SODIUM: 138 mmol/L (ref 135–145)

## 2015-08-01 LAB — HIV ANTIBODY (ROUTINE TESTING W REFLEX): HIV Screen 4th Generation wRfx: NONREACTIVE

## 2015-08-01 MED ORDER — METHOCARBAMOL 500 MG PO TABS: 500.0000 mg | ORAL_TABLET | Freq: Three times a day (TID) | ORAL | Status: AC | PRN

## 2015-08-01 MED ORDER — FLUTICASONE-SALMETEROL 250-50 MCG/DOSE IN AEPB: 1.0000 | INHALATION_SPRAY | Freq: Two times a day (BID) | RESPIRATORY_TRACT | Status: AC

## 2015-08-01 MED ORDER — OXYCODONE HCL 5 MG PO TABS: 5.0000 mg | ORAL_TABLET | Freq: Four times a day (QID) | ORAL | Status: AC | PRN

## 2015-08-01 MED ORDER — DM-GUAIFENESIN ER 30-600 MG PO TB12: 1.0000 | ORAL_TABLET | Freq: Two times a day (BID) | ORAL | Status: AC

## 2015-08-01 MED ORDER — NICOTINE 14 MG/24HR TD PT24: 14.0000 mg | MEDICATED_PATCH | Freq: Every day | TRANSDERMAL | Status: AC

## 2015-08-01 MED ORDER — LEVOFLOXACIN 750 MG PO TABS: 750.0000 mg | ORAL_TABLET | Freq: Every day | ORAL | Status: AC

## 2015-08-01 MED ORDER — IPRATROPIUM-ALBUTEROL 20-100 MCG/ACT IN AERS: 1.0000 | INHALATION_SPRAY | Freq: Four times a day (QID) | RESPIRATORY_TRACT | Status: AC | PRN

## 2015-08-01 MED ORDER — IPRATROPIUM-ALBUTEROL 0.5-2.5 (3) MG/3ML IN SOLN
3.0000 mL | Freq: Four times a day (QID) | RESPIRATORY_TRACT | Status: DC
  Administered 2015-08-01: 3 mL via RESPIRATORY_TRACT
  Filled 2015-08-01 (×2): qty 3

## 2015-08-01 MED ORDER — AZITHROMYCIN 250 MG PO TABS: 250.0000 mg | ORAL_TABLET | Freq: Two times a day (BID) | ORAL | Status: AC

## 2015-08-01 NOTE — Plan of Care (Signed)
Notified Dr. Gwenlyn PerkingMadera of the patient's O2 saturations.  Voiced to him that the patient voiced no complaints or concerns voiced with the assessment.  New orders were given and followed.  The patient was notified of the home O2 ordering process.  They verbalized understanding.

## 2015-08-01 NOTE — Discharge Summary (Signed)
Physician Discharge Summary  Destiny FinnBarbara H Clay ZOX:[id number] DOB: 06-May-1981 DOA: 07/30/2015  PCP: No PCP Per Patient  Admit date: 07/30/2015 Discharge date: 08/01/2015  Time spent: 40 minutes  Recommendations for Outpatient Follow-up:  1. Needs PFT's and further long term management of her lung disease 2. Repeat CBC to follow WBC's trend 3. Please repeat CXR in 3 weeks to follow resolution of infiltrates   Discharge Diagnoses:  Acute resp failure with hypoxia Asthma/COPD CAP (community acquired pneumonia) Tobacco abuse  alcohol abuse Chronic pain syndrome  Leukocytosis   Discharge Condition: stable and improved. Patient discharge home and ask to follow with PCP in 10 days. Also advise to make appointment with pulmonologist for further evaluation and treatment.  Diet recommendation: regular diet   Filed Weights   07/30/15 1829 07/30/15 2302  Weight: 48.081 kg (106 lb) 46.358 kg (102 lb 3.2 oz)    History of present illness:  34 year old lady who came to ED complaining of a two-week history of productive cough, chest pain mostly on the right side, fever and wheezing associated with dyspnea. In the last week or so PTA, her symptoms worsening and despite increase use of her albuterol inhaler she is having trouble catching up her breath. Evaluation in the emergency room shows her to have bilateral what appears to be pneumonia. WBC's up to 26,000 range and her O2 sat's on RA were in mid 70's. She is a smoker long-standing. She does have a previous history of heavy alcohol abuse but has seemed to improved on this. She denies any IV drug abuse. She says she is on methadone to help with the alcohol abuse.   Hospital Course:  1-acute resp failure with hypoxia due to CAP: patient is feeling much better and asking to be discharge  -WBC's trending down and patient is no longer febrile -still with rhonchi in her bilateral lung fields, some exp wheezing and requiring 3L [place] to maintain O2 sat  > 90-92% -will discharge on start patient on Advair, PRN combivent, mucinex and flutter valve -flu by PCR is negative  2-tobacco abuse: cessation counseling provided -Will discharge on nicotine patch  3-alcohol abuse: patient reports to be sober and following outpatient program -she is on methadone for it   4-leukocytosis: due to #1 -trending down appropriately -no further fever -responding well to treatment -will complete antibiotic therapy   5-chronic pain and acute pleurisy from PNA: -continue methadone -discharge on oxycodone and robaxin orally to help with breakthrough of her chest pain/pleurisy from PNA  6-Asthma/COPD: moderate persistent  -will discharge on PRN combivent and Advair  -will benefit of pulmonology evaluation as an outpatient for PFT's and further decision on long term management of her lung problems   Procedures:  See below for x-ray reports   Consultations:  None   Discharge Exam: Filed Vitals:   08/01/15 0536  BP: 128/72  Pulse: 56  Temp: 98.1 F (36.7 C)  Resp: 18    General: Afebrile, complaining of right side CP (from coughing), even endorses is much better , no nausea or vomiting. Reports improvement in her breathing. At discharge On 3L [place] to maintain O2 sat above 90%  Cardiovascular: S1 and s2, no rubs or gallops  Respiratory: improve air movement, some exp wheezing and still with appreciated rhonchi (diffusely)  Abdomen: soft, NT, ND, positive BS  Musculoskeletal: No edema, no cyanosis or clubbing  Discharge Instructions   Discharge Instructions    Discharge instructions    Complete by:  As directed  Arrange follow up with PCP in 10 days Take medications as prescribed Increase activity gradually Please stop smoking and avoid exposure to second hand smoking  Keep yourself well hydrated          Current Discharge Medication List    START taking these medications   Details  azithromycin (ZITHROMAX) 250 MG tablet Take 1  tablet (250 mg total) by mouth 2 (two) times daily. Qty: 14 each, Refills: 0    dextromethorphan-guaiFENesin (MUCINEX DM) 30-600 MG 12hr tablet Take 1 tablet by mouth 2 (two) times daily. Qty: 40 tablet, Refills: 0    Fluticasone-Salmeterol (ADVAIR DISKUS) 250-50 MCG/DOSE AEPB Inhale 1 puff into the lungs 2 (two) times daily. Qty: 60 each, Refills: 1    Ipratropium-Albuterol (COMBIVENT RESPIMAT) 20-100 MCG/ACT AERS respimat Inhale 1 puff into the lungs every 6 (six) hours as needed for wheezing or shortness of breath. Qty: 1 Inhaler, Refills: 1    levofloxacin (LEVAQUIN) 750 MG tablet Take 1 tablet (750 mg total) by mouth daily. Qty: 7 tablet, Refills: 0    methocarbamol (ROBAXIN) 500 MG tablet Take 1 tablet (500 mg total) by mouth every 8 (eight) hours as needed for muscle spasms. Qty: 30 tablet, Refills: 0    nicotine (NICODERM CQ - DOSED IN MG/24 HOURS) 14 mg/24hr patch Place 1 patch (14 mg total) onto the skin daily. Qty: 28 patch, Refills: 0    oxyCODONE (OXY IR/ROXICODONE) 5 MG immediate release tablet Take 1-2 tablets (5-10 mg total) by mouth every 6 (six) hours as needed for severe pain or breakthrough pain. Qty: 30 tablet, Refills: 0      CONTINUE these medications which have NOT CHANGED   Details  methadone (DOLOPHINE) 10 MG/ML solution Take 145 mg by mouth daily.      STOP taking these medications     Pseudoeph-Doxylamine-DM-APAP (DAYQUIL/NYQUIL COLD/FLU RELIEF PO)        No Known Allergies   The results of significant diagnostics from this hospitalization (including imaging, microbiology, ancillary and laboratory) are listed below for reference.    Significant Diagnostic Studies: Dg Chest 2 View  08/01/2015  CLINICAL DATA:  Shortness of breath, history asthma EXAM: CHEST  2 VIEW COMPARISON:  07/30/2015 FINDINGS: Normal heart size, mediastinal contours and pulmonary vascularity. Emphysematous and bronchitic changes consistent with COPD. Persistent  reticulonodular interstitial changes throughout both lungs similar to previous exam, shown to be tree-in-bud infiltrates on interval CT chest. No superimposed segmental consolidation, pleural effusion or pneumothorax. Cavitary focus question on previous exam no longer identified. Bones demineralized. IMPRESSION: COPD changes with persistent reticulonodular interstitial infiltrates likely representing infection. Little interval change. Electronically Signed   By: Ulyses Southward M.D.   On: 08/01/2015 10:01   Dg Chest 2 View  07/30/2015  CLINICAL DATA:  Fever, cough, shortness of breath, body aches, right rib pain EXAM: CHEST  2 VIEW COMPARISON:  None. FINDINGS: Diffuse reticulonodular opacities in the bilateral lungs. Suspected 7 mm cavitary nodular opacity overlying the right mid lung. Overall, this appearance suggests an atypical mycobacterial, viral, or fungal infection. No pleural effusion or pneumothorax. The heart is normal in size. Visualized osseous structures are within normal limits. IMPRESSION: Diffuse reticulonodular opacities in the bilateral lungs, suggesting atypical infection. CT chest is suggested for further evaluation. Electronically Signed   By: Charline Bills M.D.   On: 07/30/2015 19:40   Ct Chest Wo Contrast  07/30/2015  CLINICAL DATA:  RIGHT rib pain, intermittent fever since yesterday, cough productive of yellow, green, and white  material, body aches since yesterday, shortness of breath, LEFT nephrectomy for an unspecified cause, smoker, asthma EXAM: CT CHEST WITHOUT CONTRAST TECHNIQUE: Multidetector CT imaging of the chest was performed following the standard protocol without IV contrast. Sagittal and coronal MPR images reconstructed from axial data set. COMPARISON:  Chest radiograph 07/30/2015 FINDINGS: Minimal atherosclerotic calcification. Upper normal sized RIGHT paratracheal lymph nodes up to 10 mm short axis. No additional adenopathy, though hilar assessment is limited by lack of  IV contrast. Visualized upper abdomen unremarkable. Peribronchovascular reticulonodular tree-in-bud opacities are identified throughout both lungs with a lower lobe predominance. Minimal associated peribronchial thickening. Few areas of scattered nonspecific ground-glass attenuation in the RIGHT middle lobe. No nodular or cavitary foci identified. No pleural effusion or pneumothorax. Osseous structures unremarkable. IMPRESSION: Tree in bud opacities throughout both lungs greatest in the lower lobes with minimal associated ground-glass opacity in RIGHT middle lobe. While atypical infectious etiologies can cause this appearance, this is most likely due to infectious bronchiolitis of bacterial or potentially viral etiology. Recommend followup radiographs until resolution to exclude other etiologies. Electronically Signed   By: Ulyses Southward M.D.   On: 07/30/2015 20:24    Microbiology: Recent Results (from the past 240 hour(s))  Culture, blood (routine x 2)     Status: None (Preliminary result)   Collection Time: 07/30/15  9:10 PM  Result Value Ref Range Status   Specimen Description LEFT ANTECUBITAL  Final   Special Requests BOTTLES DRAWN AEROBIC AND ANAEROBIC 6CC  Final   Culture NO GROWTH 2 DAYS  Final   Report Status PENDING  Incomplete  Culture, blood (routine x 2)     Status: None (Preliminary result)   Collection Time: 07/30/15  9:16 PM  Result Value Ref Range Status   Specimen Description LEFT ANTECUBITAL  Final   Special Requests BOTTLES DRAWN AEROBIC AND ANAEROBIC 6CC  Final   Culture NO GROWTH 2 DAYS  Final   Report Status PENDING  Incomplete     Labs: Basic Metabolic Panel:  Recent Labs Lab 07/30/15 1920 07/31/15 0522 08/01/15 0700  NA 132* 136 138  K 3.6 5.2* 4.4  CL 92* 100* 101  CO2 29 29 32  GLUCOSE 78 155* 88  BUN <5* 6 5*  CREATININE 0.55 0.50 0.56  CALCIUM 8.7* 8.5* 8.4*   Liver Function Tests:  Recent Labs Lab 07/30/15 1920 07/31/15 0522  AST 25 19  ALT 19  17  ALKPHOS 116 89  BILITOT 0.5 0.2*  PROT 7.7 6.8  ALBUMIN 3.5 2.9*    Recent Labs Lab 07/30/15 1920  LIPASE 17   CBC:  Recent Labs Lab 07/30/15 1920 07/31/15 0522 08/01/15 0700  WBC 26.4* 16.1* 13.0*  NEUTROABS 21.4*  --   --   HGB 14.7 13.1 13.6  HCT 42.0 39.1 41.2  MCV 90.5 90.7 92.0  PLT 448* 449* 428*   Cardiac Enzymes:  Recent Labs Lab 07/30/15 1920  TROPONINI <0.03    Signed:  Vassie Loll  [facility] 08/01/2015, 10:23 AM

## 2015-08-01 NOTE — Plan of Care (Signed)
SATURATION QUALIFICATIONS: (This note is used to comply with regulatory documentation for home oxygen)  Patient Saturations on Room Air at Rest = 79%  Patient Saturations on Room Air while Ambulating = 82%  Patient Saturations on 2 Liters of oxygen while Ambulating = 97%  Please briefly explain why patient needs home oxygen: Patients sats were below 88% prior to even walking.

## 2015-08-01 NOTE — Progress Notes (Signed)
Utilization review Completed Shawnn Bouillon RN BSN   

## 2015-08-01 NOTE — Progress Notes (Signed)
Patient discharged with instructions, prescription, and care notes.  Verbalized understanding via teach back.  IV was removed and the site was WNL. Patient voiced no further complaints or concerns at the time of discharge.  Appointments scheduled per instructions.  Patient is ready to leave the floor via w/c with staff and family in stable condition.  She only waiting on her home O2 to arrive to the unit in which Amber stated it should be here by 2000.

## 2015-08-01 NOTE — Progress Notes (Signed)
Scheduled duoneb txs have been ordered.

## 2015-08-02 LAB — LEGIONELLA PNEUMOPHILA SEROGP 1 UR AG: L. PNEUMOPHILA SEROGP 1 UR AG: NEGATIVE

## 2015-08-04 LAB — CULTURE, BLOOD (ROUTINE X 2)
CULTURE: NO GROWTH
CULTURE: NO GROWTH

## 2017-05-12 IMAGING — CT CT CHEST W/O CM
2 of 3 series · 15 of 36 positions shown, 18 images · non-contrast
Comparison: Chest radiograph 07/30/2015

CLINICAL DATA: RIGHT rib pain, intermittent fever since yesterday,
cough productive of yellow, green, and white material, body aches
since yesterday, shortness of breath, LEFT nephrectomy for an
unspecified cause, smoker, asthma

EXAM:
CT CHEST WITHOUT CONTRAST
TECHNIQUE: Multidetector CT imaging of the chest was performed following the
standard protocol without IV contrast. Sagittal and coronal MPR
images reconstructed from axial data set.

[Series 2: chestroutine 5.0 b40f · axial · 0.59mm/px · z∈[+10,+300]mm · 12 of 68 slices shown, 15 images]
[im 5/68  mediastinal]
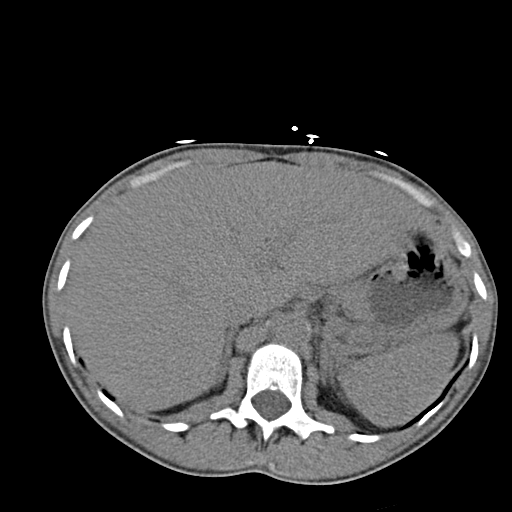
[im 5/68  lung]
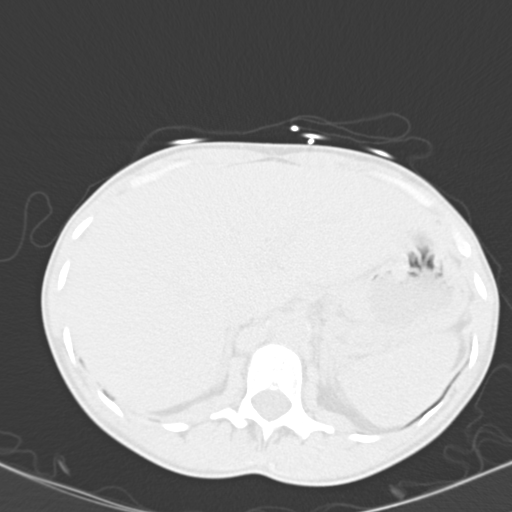
[im 10/68  lung]
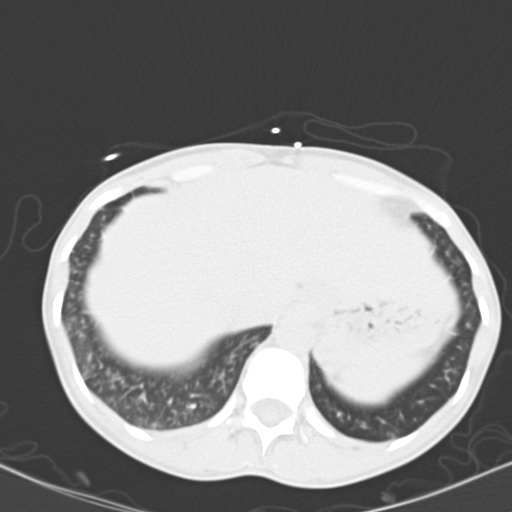
[im 15/68  lung]
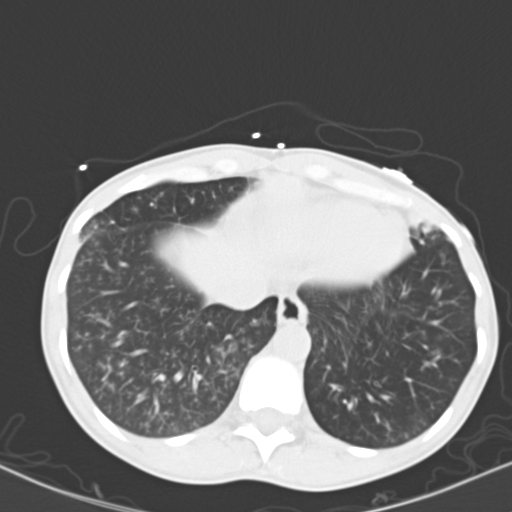
[im 20/68  lung]
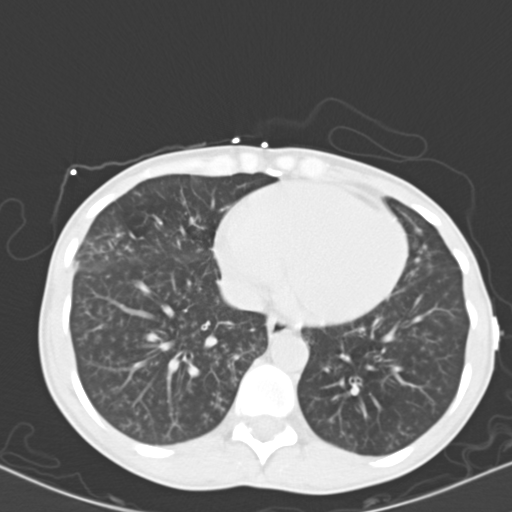
[im 25/68  mediastinal]
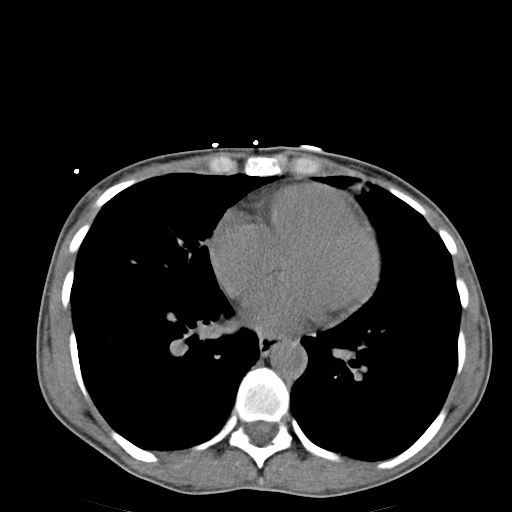
[im 25/68  lung]
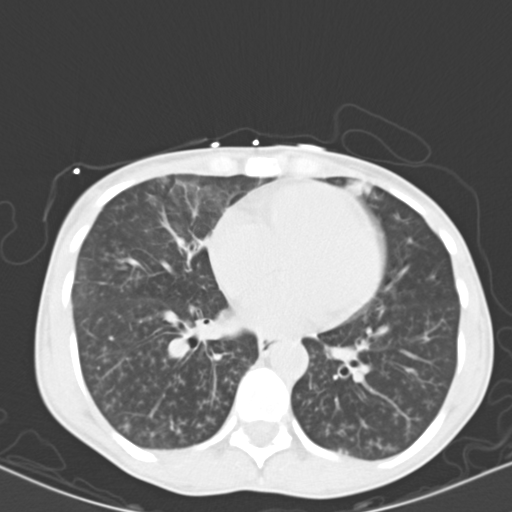
[im 30/68  lung]
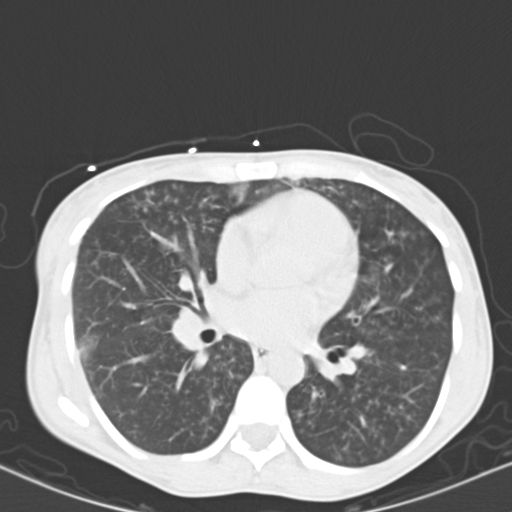
[im 38/68  lung]
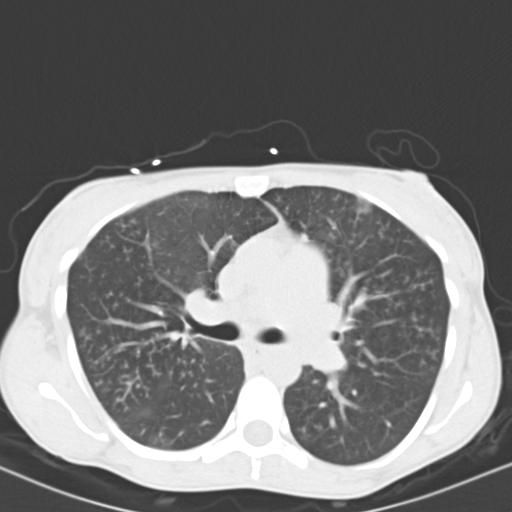
[im 43/68  lung]
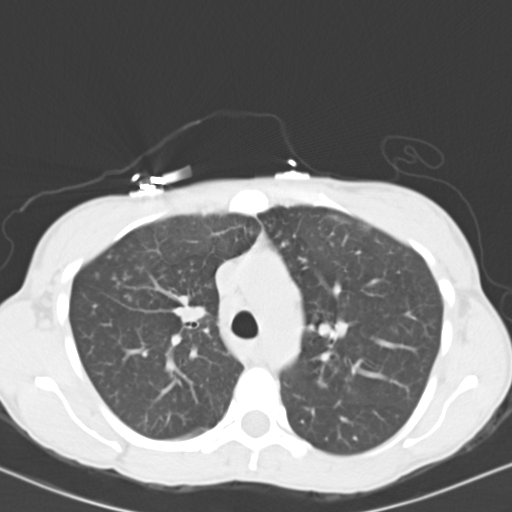
[im 48/68  mediastinal]
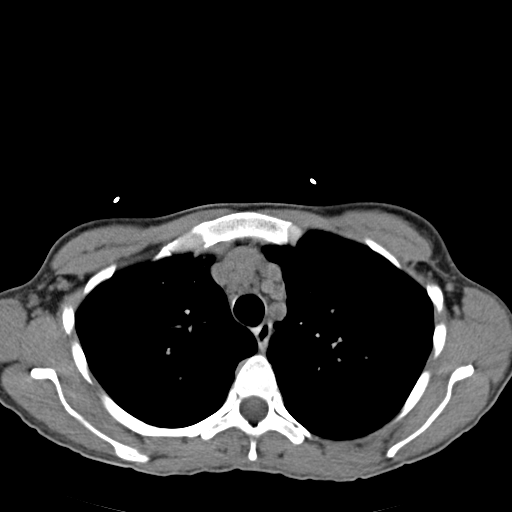
[im 48/68  lung]
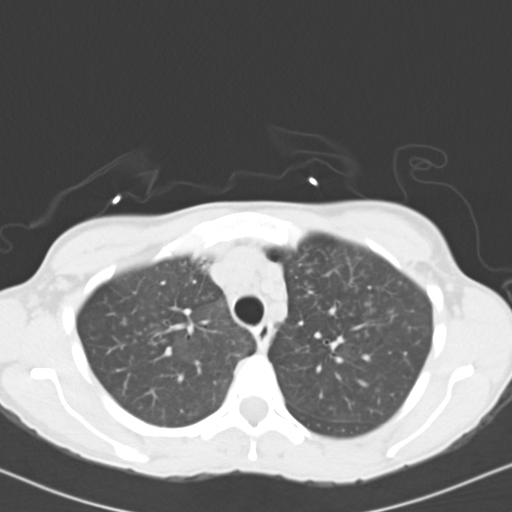
[im 53/68  lung]
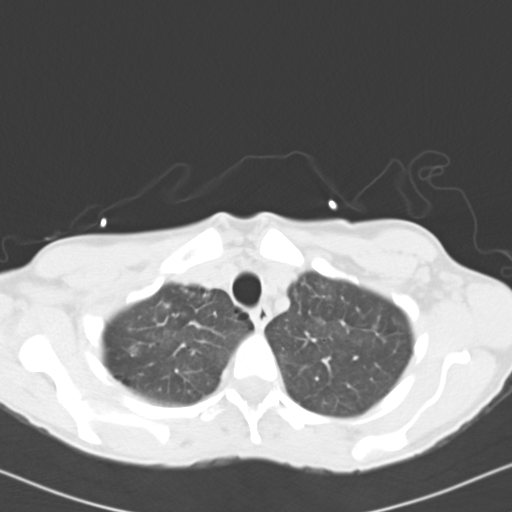
[im 58/68  lung]
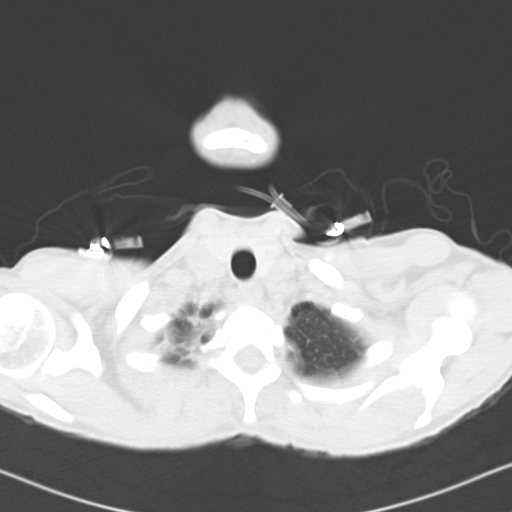
[im 63/68  lung]
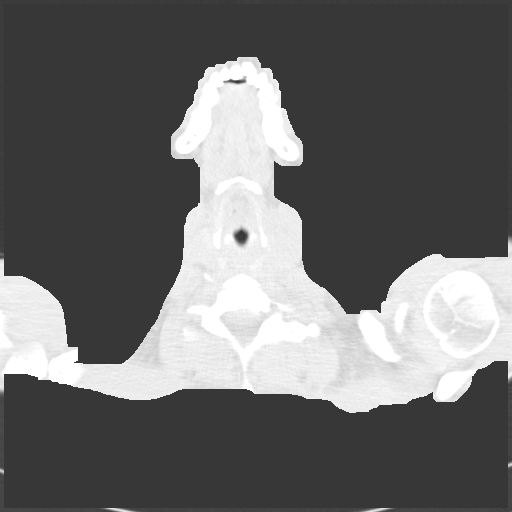

[Series 4: mpr coro 3mm · coronal · 0.58mm/px · 3 of 77 slices shown]
[im 16/77  lung]
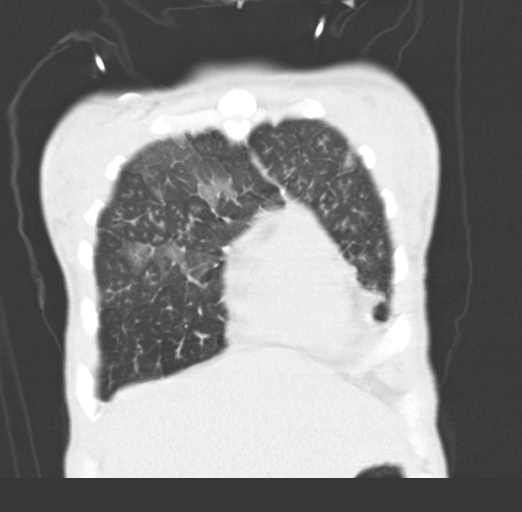
[im 31/77  lung]
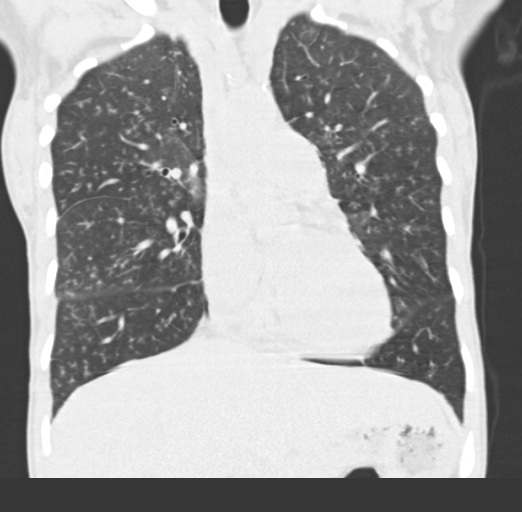
[im 46/77  lung]
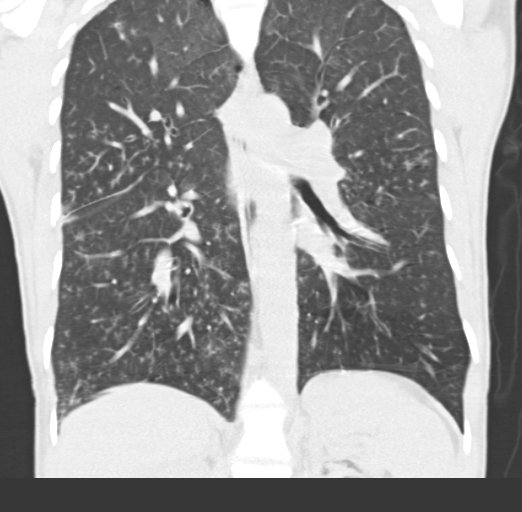

[15 of 36 positions shown; findings below may reference images not displayed]

FINDINGS: Minimal atherosclerotic calcification.

Upper normal sized RIGHT paratracheal lymph nodes up to 10 mm short
axis.

No additional adenopathy, though hilar assessment is limited by lack
of IV contrast.

Visualized upper abdomen unremarkable.

Peribronchovascular reticulonodular tree-in-bud opacities are
identified throughout both lungs with a lower lobe predominance.

Minimal associated peribronchial thickening.

Few areas of scattered nonspecific ground-glass attenuation in the
RIGHT middle lobe.

No nodular or cavitary foci identified.

No pleural effusion or pneumothorax.

Osseous structures unremarkable.
IMPRESSION: Tree in bud opacities throughout both lungs greatest in the lower
lobes with minimal associated ground-glass opacity in RIGHT middle
lobe.

While atypical infectious etiologies can cause this appearance, this
is most likely due to infectious bronchiolitis of bacterial or
potentially viral etiology.

Recommend followup radiographs until resolution to exclude other
etiologies.

## 2017-05-14 IMAGING — DX DG CHEST 2V
2 series · 2 of 2 positions shown · non-contrast
Comparison: 07/30/2015

CLINICAL DATA: Shortness of breath, history asthma

EXAM:
CHEST  2 VIEW

[chest pa]
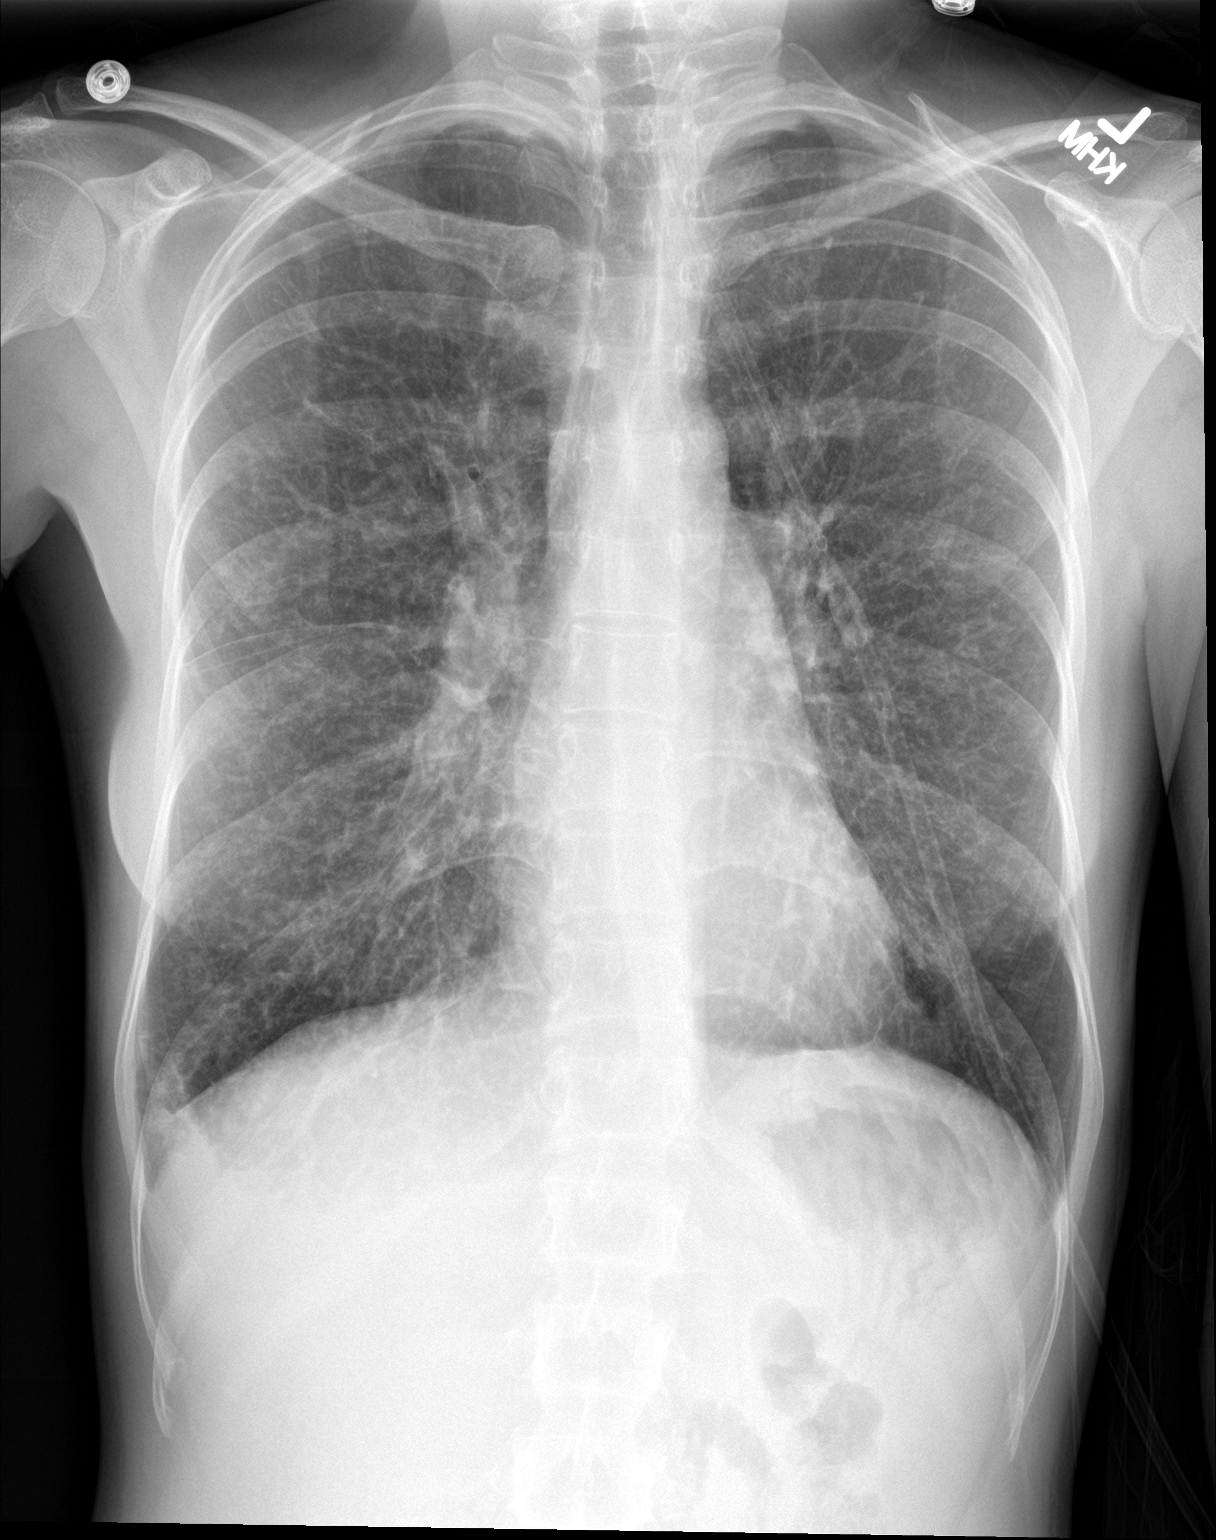

[chest lat]
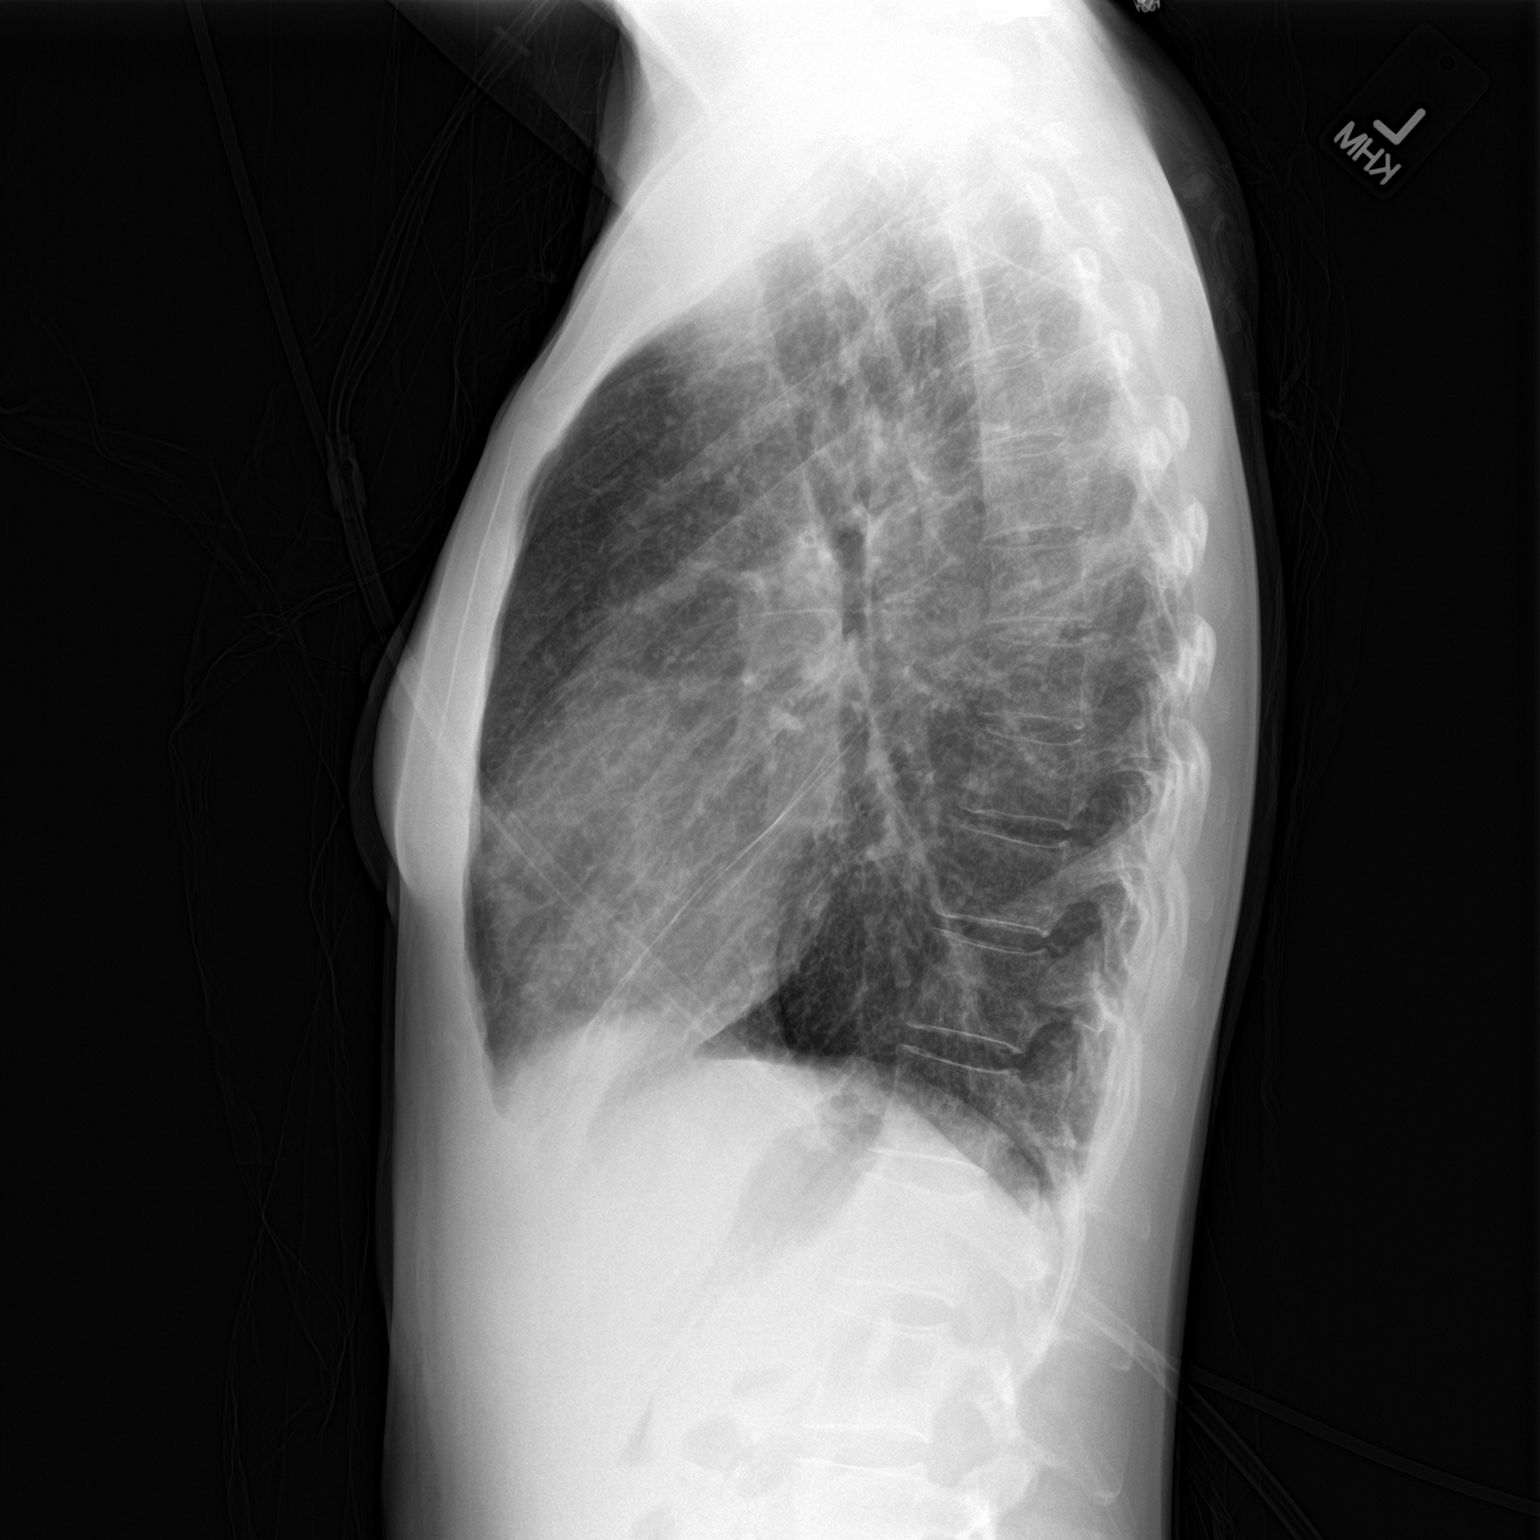

[2 of 2 positions shown; findings below may reference images not displayed]

FINDINGS: Normal heart size, mediastinal contours and pulmonary vascularity.

Emphysematous and bronchitic changes consistent with COPD.

Persistent reticulonodular interstitial changes throughout both
lungs similar to previous exam, shown to be tree-in-bud infiltrates
on interval CT chest.

No superimposed segmental consolidation, pleural effusion or
pneumothorax.

Cavitary focus question on previous exam no longer identified.

Bones demineralized.
IMPRESSION: COPD changes with persistent reticulonodular interstitial
infiltrates likely representing infection.

Little interval change.

## 2018-03-09 DEATH — deceased
